# Patient Record
Sex: Male | Born: 1990 | Race: White | Hispanic: No | Marital: Single
Health system: Southern US, Community
[De-identification: ages and names within clinical notes are randomized; demographics above are authoritative.]

## PROBLEM LIST (undated history)

## (undated) DIAGNOSIS — Q6689 Other  specified congenital deformities of feet: Secondary | ICD-10-CM

## (undated) DIAGNOSIS — F840 Autistic disorder: Secondary | ICD-10-CM

## (undated) HISTORY — PX: TESTICLE SURGERY: SHX794

## (undated) HISTORY — PX: OTHER SURGICAL HISTORY: SHX169

---

## 2009-10-23 ENCOUNTER — Inpatient Hospital Stay (HOSPITAL_COMMUNITY): Admission: EM | Admit: 2009-10-23 | Discharge: 2009-12-06 | Payer: Self-pay | Admitting: Emergency Medicine

## 2009-10-24 ENCOUNTER — Ambulatory Visit: Payer: Self-pay | Admitting: Critical Care Medicine

## 2009-10-24 ENCOUNTER — Ambulatory Visit: Payer: Self-pay | Admitting: Internal Medicine

## 2009-10-24 ENCOUNTER — Encounter: Payer: Self-pay | Admitting: Internal Medicine

## 2009-10-25 ENCOUNTER — Ambulatory Visit: Payer: Self-pay | Admitting: Internal Medicine

## 2009-11-25 ENCOUNTER — Ambulatory Visit: Payer: Self-pay | Admitting: Physical Medicine & Rehabilitation

## 2010-06-09 LAB — CBC
HCT: 28 % — ABNORMAL LOW (ref 39.0–52.0)
HCT: 28.1 % — ABNORMAL LOW (ref 39.0–52.0)
Hemoglobin: 9 g/dL — ABNORMAL LOW (ref 13.0–17.0)
Hemoglobin: 9.1 g/dL — ABNORMAL LOW (ref 13.0–17.0)
MCHC: 32 g/dL (ref 30.0–36.0)
MCHC: 32.5 g/dL (ref 30.0–36.0)
MCV: 90.9 fL (ref 78.0–100.0)
MCV: 91.5 fL (ref 78.0–100.0)
MCV: 91.5 fL (ref 78.0–100.0)
Platelets: 291 10*3/uL (ref 150–400)
RBC: 3.42 MIL/uL — ABNORMAL LOW (ref 4.22–5.81)
RDW: 15.7 % — ABNORMAL HIGH (ref 11.5–15.5)
RDW: 15.8 % — ABNORMAL HIGH (ref 11.5–15.5)
WBC: 5.7 10*3/uL (ref 4.0–10.5)
WBC: 7.4 10*3/uL (ref 4.0–10.5)

## 2010-06-09 LAB — COMPREHENSIVE METABOLIC PANEL
ALT: 42 U/L (ref 0–53)
AST: 27 U/L (ref 0–37)
Albumin: 2.8 g/dL — ABNORMAL LOW (ref 3.5–5.2)
Alkaline Phosphatase: 124 U/L — ABNORMAL HIGH (ref 39–117)
BUN: 13 mg/dL (ref 6–23)
CO2: 26 mEq/L (ref 19–32)
CO2: 27 mEq/L (ref 19–32)
Calcium: 8.7 mg/dL (ref 8.4–10.5)
Creatinine, Ser: 0.74 mg/dL (ref 0.4–1.5)
GFR calc Af Amer: >60 mL/min (ref >60)
GFR calc non Af Amer: >60 mL/min (ref >60)
GFR calc non Af Amer: >60 mL/min (ref >60)
Glucose, Bld: 85 mg/dL (ref 70–99)
Glucose, Bld: 90 mg/dL (ref 70–99)
Potassium: 3.7 mEq/L (ref 3.5–5.1)
Sodium: 145 mEq/L (ref 135–145)
Total Protein: 6.6 g/dL (ref 6.0–8.3)
Total Protein: 6.8 g/dL (ref 6.0–8.3)

## 2010-06-09 LAB — BASIC METABOLIC PANEL
BUN: 15 mg/dL (ref 6–23)
CO2: 27 mEq/L (ref 19–32)
Calcium: 8.7 mg/dL (ref 8.4–10.5)
Calcium: 8.8 mg/dL (ref 8.4–10.5)
Chloride: 111 mEq/L (ref 96–112)
Chloride: 111 mEq/L (ref 96–112)
Creatinine, Ser: 0.66 mg/dL (ref 0.4–1.5)
Creatinine, Ser: 0.68 mg/dL (ref 0.4–1.5)
GFR calc Af Amer: >60 mL/min (ref >60)
GFR calc Af Amer: >60 mL/min (ref >60)
GFR calc non Af Amer: >60 mL/min (ref >60)

## 2010-06-09 LAB — GLUCOSE, CAPILLARY: Glucose-Capillary: 93 mg/dL (ref 70–99)

## 2010-06-10 LAB — CBC
HCT: 25.2 % — ABNORMAL LOW (ref 39.0–52.0)
HCT: 27 % — ABNORMAL LOW (ref 39.0–52.0)
HCT: 27.2 % — ABNORMAL LOW (ref 39.0–52.0)
HCT: 28.4 % — ABNORMAL LOW (ref 39.0–52.0)
HCT: 28.6 % — ABNORMAL LOW (ref 39.0–52.0)
HCT: 21.6 % — ABNORMAL LOW (ref 39.0–52.0)
HCT: 21.8 % — ABNORMAL LOW (ref 39.0–52.0)
HCT: 21.9 % — ABNORMAL LOW (ref 39.0–52.0)
HCT: 22.1 % — ABNORMAL LOW (ref 39.0–52.0)
HCT: 23.9 % — ABNORMAL LOW (ref 39.0–52.0)
HCT: 24.5 % — ABNORMAL LOW (ref 39.0–52.0)
HCT: 25 % — ABNORMAL LOW (ref 39.0–52.0)
HCT: 25.4 % — ABNORMAL LOW (ref 39.0–52.0)
HCT: 30.7 % — ABNORMAL LOW (ref 39.0–52.0)
Hemoglobin: 8.4 g/dL — ABNORMAL LOW (ref 13.0–17.0)
Hemoglobin: 8.6 g/dL — ABNORMAL LOW (ref 13.0–17.0)
Hemoglobin: 8.7 g/dL — ABNORMAL LOW (ref 13.0–17.0)
Hemoglobin: 8.9 g/dL — ABNORMAL LOW (ref 13.0–17.0)
Hemoglobin: 8.9 g/dL — ABNORMAL LOW (ref 13.0–17.0)
Hemoglobin: 9.1 g/dL — ABNORMAL LOW (ref 13.0–17.0)
Hemoglobin: 9.3 g/dL — ABNORMAL LOW (ref 13.0–17.0)
Hemoglobin: 10.8 g/dL — ABNORMAL LOW (ref 13.0–17.0)
Hemoglobin: 7.1 g/dL — ABNORMAL LOW (ref 13.0–17.0)
Hemoglobin: 7.5 g/dL — ABNORMAL LOW (ref 13.0–17.0)
Hemoglobin: 7.9 g/dL — ABNORMAL LOW (ref 13.0–17.0)
Hemoglobin: 8.3 g/dL — ABNORMAL LOW (ref 13.0–17.0)
Hemoglobin: 8.4 g/dL — ABNORMAL LOW (ref 13.0–17.0)
Hemoglobin: 8.7 g/dL — ABNORMAL LOW (ref 13.0–17.0)
MCH: 28.6 pg (ref 26.0–34.0)
MCH: 29.1 pg (ref 26.0–34.0)
MCH: 30 pg (ref 26.0–34.0)
MCH: 30.6 pg (ref 26.0–34.0)
MCH: 30.9 pg (ref 26.0–34.0)
MCH: 30.9 pg (ref 26.0–34.0)
MCH: 31 pg (ref 26.0–34.0)
MCHC: 32.5 g/dL (ref 30.0–36.0)
MCHC: 32.6 g/dL (ref 30.0–36.0)
MCHC: 32.9 g/dL (ref 30.0–36.0)
MCHC: 34.4 g/dL (ref 30.0–36.0)
MCHC: 35.1 g/dL (ref 30.0–36.0)
MCV: 89.4 fL (ref 78.0–100.0)
MCV: 90 fL (ref 78.0–100.0)
MCV: 90.2 fL (ref 78.0–100.0)
MCV: 90.7 fL (ref 78.0–100.0)
MCV: 88.2 fL (ref 78.0–100.0)
MCV: 89.6 fL (ref 78.0–100.0)
MCV: 91.6 fL (ref 78.0–100.0)
MCV: 93.2 fL (ref 78.0–100.0)
Platelets: 241 10*3/uL (ref 150–400)
Platelets: 313 10*3/uL (ref 150–400)
Platelets: 110 10*3/uL — ABNORMAL LOW (ref 150–400)
Platelets: 133 10*3/uL — ABNORMAL LOW (ref 150–400)
Platelets: 209 10*3/uL (ref 150–400)
Platelets: 271 10*3/uL (ref 150–400)
Platelets: 74 10*3/uL — ABNORMAL LOW (ref 150–400)
RBC: 2.82 MIL/uL — ABNORMAL LOW (ref 4.22–5.81)
RBC: 2.94 MIL/uL — ABNORMAL LOW (ref 4.22–5.81)
RBC: 2.97 MIL/uL — ABNORMAL LOW (ref 4.22–5.81)
RBC: 3 MIL/uL — ABNORMAL LOW (ref 4.22–5.81)
RBC: 3.01 MIL/uL — ABNORMAL LOW (ref 4.22–5.81)
RBC: 3.06 MIL/uL — ABNORMAL LOW (ref 4.22–5.81)
RBC: 3.06 MIL/uL — ABNORMAL LOW (ref 4.22–5.81)
RBC: 3.18 MIL/uL — ABNORMAL LOW (ref 4.22–5.81)
RBC: 2.33 MIL/uL — ABNORMAL LOW (ref 4.22–5.81)
RBC: 2.39 MIL/uL — ABNORMAL LOW (ref 4.22–5.81)
RBC: 2.45 MIL/uL — ABNORMAL LOW (ref 4.22–5.81)
RBC: 2.48 MIL/uL — ABNORMAL LOW (ref 4.22–5.81)
RBC: 2.52 MIL/uL — ABNORMAL LOW (ref 4.22–5.81)
RBC: 2.63 MIL/uL — ABNORMAL LOW (ref 4.22–5.81)
RBC: 2.68 MIL/uL — ABNORMAL LOW (ref 4.22–5.81)
RBC: 2.88 MIL/uL — ABNORMAL LOW (ref 4.22–5.81)
RBC: 3.26 MIL/uL — ABNORMAL LOW (ref 4.22–5.81)
RBC: 3.5 MIL/uL — ABNORMAL LOW (ref 4.22–5.81)
RDW: 12.4 % (ref 11.5–15.5)
RDW: 13.2 % (ref 11.5–15.5)
RDW: 13.7 % (ref 11.5–15.5)
RDW: 14.1 % (ref 11.5–15.5)
RDW: 14.1 % (ref 11.5–15.5)
RDW: 14.4 % (ref 11.5–15.5)
RDW: 14.5 % (ref 11.5–15.5)
WBC: 5.3 10*3/uL (ref 4.0–10.5)
WBC: 7.6 10*3/uL (ref 4.0–10.5)
WBC: 7.7 10*3/uL (ref 4.0–10.5)
WBC: 7.8 10*3/uL (ref 4.0–10.5)
WBC: 9.3 10*3/uL (ref 4.0–10.5)
WBC: 9.8 10*3/uL (ref 4.0–10.5)
WBC: 10.7 10*3/uL — ABNORMAL HIGH (ref 4.0–10.5)
WBC: 10.8 10*3/uL — ABNORMAL HIGH (ref 4.0–10.5)
WBC: 14.3 10*3/uL — ABNORMAL HIGH (ref 4.0–10.5)
WBC: 4.7 10*3/uL (ref 4.0–10.5)
WBC: 6.3 10*3/uL (ref 4.0–10.5)
WBC: 8.1 10*3/uL (ref 4.0–10.5)
WBC: 8.8 10*3/uL (ref 4.0–10.5)
WBC: 9.4 10*3/uL (ref 4.0–10.5)
WBC: 9.5 10*3/uL (ref 4.0–10.5)

## 2010-06-10 LAB — POCT I-STAT 3, ART BLOOD GAS (G3+)
Acid-base deficit: 12 mmol/L — ABNORMAL HIGH (ref 0.0–2.0)
Acid-base deficit: 12 mmol/L — ABNORMAL HIGH (ref 0.0–2.0)
Acid-base deficit: 13 mmol/L — ABNORMAL HIGH (ref 0.0–2.0)
Acid-base deficit: 13 mmol/L — ABNORMAL HIGH (ref 0.0–2.0)
Bicarbonate: 29.8 mEq/L — ABNORMAL HIGH (ref 20.0–24.0)
Bicarbonate: 11.9 mEq/L — ABNORMAL LOW (ref 20.0–24.0)
Bicarbonate: 13.5 mEq/L — ABNORMAL LOW (ref 20.0–24.0)
Bicarbonate: 14.1 mEq/L — ABNORMAL LOW (ref 20.0–24.0)
Bicarbonate: 14.1 mEq/L — ABNORMAL LOW (ref 20.0–24.0)
O2 Saturation: 81 %
O2 Saturation: 96 %
O2 Saturation: 98 %
O2 Saturation: 98 %
Patient temperature: 99.5
TCO2: 31 mmol/L (ref 0–100)
TCO2: 15 mmol/L (ref 0–100)
pCO2 arterial: 45.1 mmHg — ABNORMAL HIGH (ref 35.0–45.0)
pCO2 arterial: 21.9 mmHg — ABNORMAL LOW (ref 35.0–45.0)
pH, Arterial: 7.427 (ref 7.350–7.450)
pO2, Arterial: 101 mmHg — ABNORMAL HIGH (ref 80.0–100.0)
pO2, Arterial: 112 mmHg — ABNORMAL HIGH (ref 80.0–100.0)
pO2, Arterial: 113 mmHg — ABNORMAL HIGH (ref 80.0–100.0)

## 2010-06-10 LAB — BASIC METABOLIC PANEL
BUN: 17 mg/dL (ref 6–23)
BUN: 5 mg/dL — ABNORMAL LOW (ref 6–23)
BUN: 19 mg/dL (ref 6–23)
BUN: 21 mg/dL (ref 6–23)
BUN: 21 mg/dL (ref 6–23)
BUN: 23 mg/dL (ref 6–23)
BUN: 31 mg/dL — ABNORMAL HIGH (ref 6–23)
CO2: 29 mEq/L (ref 19–32)
CO2: 30 mEq/L (ref 19–32)
CO2: 31 mEq/L (ref 19–32)
CO2: 16 mEq/L — ABNORMAL LOW (ref 19–32)
CO2: 17 mEq/L — ABNORMAL LOW (ref 19–32)
CO2: 17 mEq/L — ABNORMAL LOW (ref 19–32)
CO2: 24 mEq/L (ref 19–32)
CO2: 28 mEq/L (ref 19–32)
Calcium: 7.9 mg/dL — ABNORMAL LOW (ref 8.4–10.5)
Calcium: 8.3 mg/dL — ABNORMAL LOW (ref 8.4–10.5)
Calcium: 7.4 mg/dL — ABNORMAL LOW (ref 8.4–10.5)
Calcium: 7.4 mg/dL — ABNORMAL LOW (ref 8.4–10.5)
Calcium: 7.6 mg/dL — ABNORMAL LOW (ref 8.4–10.5)
Calcium: 7.6 mg/dL — ABNORMAL LOW (ref 8.4–10.5)
Calcium: 7.7 mg/dL — ABNORMAL LOW (ref 8.4–10.5)
Calcium: 7.8 mg/dL — ABNORMAL LOW (ref 8.4–10.5)
Calcium: 7.9 mg/dL — ABNORMAL LOW (ref 8.4–10.5)
Chloride: 103 mEq/L (ref 96–112)
Chloride: 105 mEq/L (ref 96–112)
Chloride: 106 mEq/L (ref 96–112)
Chloride: 107 mEq/L (ref 96–112)
Chloride: 108 mEq/L (ref 96–112)
Chloride: 111 mEq/L (ref 96–112)
Chloride: 123 mEq/L — ABNORMAL HIGH (ref 96–112)
Chloride: 126 mEq/L — ABNORMAL HIGH (ref 96–112)
Chloride: 128 mEq/L — ABNORMAL HIGH (ref 96–112)
Creatinine, Ser: 0.89 mg/dL (ref 0.4–1.5)
Creatinine, Ser: 1.02 mg/dL (ref 0.4–1.5)
Creatinine, Ser: 1.53 mg/dL — ABNORMAL HIGH (ref 0.4–1.5)
Creatinine, Ser: 1.55 mg/dL — ABNORMAL HIGH (ref 0.4–1.5)
Creatinine, Ser: 1.9 mg/dL — ABNORMAL HIGH (ref 0.4–1.5)
GFR calc Af Amer: >60 mL/min (ref >60)
GFR calc Af Amer: >60 mL/min (ref >60)
GFR calc Af Amer: >60 mL/min (ref >60)
GFR calc Af Amer: >60 mL/min (ref >60)
GFR calc Af Amer: >60 mL/min (ref >60)
GFR calc Af Amer: >60 mL/min (ref >60)
GFR calc Af Amer: >60 mL/min (ref >60)
GFR calc Af Amer: 59 mL/min — ABNORMAL LOW (ref >60)
GFR calc Af Amer: >60 mL/min (ref >60)
GFR calc Af Amer: >60 mL/min (ref >60)
GFR calc Af Amer: >60 mL/min (ref >60)
GFR calc Af Amer: >60 mL/min (ref >60)
GFR calc Af Amer: >60 mL/min (ref >60)
GFR calc Af Amer: >60 mL/min (ref >60)
GFR calc Af Amer: >60 mL/min (ref >60)
GFR calc non Af Amer: >60 mL/min (ref >60)
GFR calc non Af Amer: >60 mL/min (ref >60)
GFR calc non Af Amer: >60 mL/min (ref >60)
GFR calc non Af Amer: >60 mL/min (ref >60)
GFR calc non Af Amer: >60 mL/min (ref >60)
GFR calc non Af Amer: 48 mL/min — ABNORMAL LOW (ref >60)
GFR calc non Af Amer: 57 mL/min — ABNORMAL LOW (ref >60)
GFR calc non Af Amer: 57 mL/min — ABNORMAL LOW (ref >60)
GFR calc non Af Amer: 58 mL/min — ABNORMAL LOW (ref >60)
GFR calc non Af Amer: >60 mL/min (ref >60)
GFR calc non Af Amer: >60 mL/min (ref >60)
GFR calc non Af Amer: >60 mL/min (ref >60)
GFR calc non Af Amer: >60 mL/min (ref >60)
GFR calc non Af Amer: >60 mL/min (ref >60)
Glucose, Bld: 124 mg/dL — ABNORMAL HIGH (ref 70–99)
Glucose, Bld: 129 mg/dL — ABNORMAL HIGH (ref 70–99)
Glucose, Bld: 106 mg/dL — ABNORMAL HIGH (ref 70–99)
Glucose, Bld: 123 mg/dL — ABNORMAL HIGH (ref 70–99)
Glucose, Bld: 125 mg/dL — ABNORMAL HIGH (ref 70–99)
Glucose, Bld: 126 mg/dL — ABNORMAL HIGH (ref 70–99)
Glucose, Bld: 156 mg/dL — ABNORMAL HIGH (ref 70–99)
Glucose, Bld: 161 mg/dL — ABNORMAL HIGH (ref 70–99)
Potassium: 2.9 mEq/L — ABNORMAL LOW (ref 3.5–5.1)
Potassium: 3.8 mEq/L (ref 3.5–5.1)
Potassium: 3.9 mEq/L (ref 3.5–5.1)
Potassium: 4.1 mEq/L (ref 3.5–5.1)
Potassium: 4.1 mEq/L (ref 3.5–5.1)
Potassium: 4.3 mEq/L (ref 3.5–5.1)
Potassium: 2.5 mEq/L — CL (ref 3.5–5.1)
Potassium: 3 mEq/L — ABNORMAL LOW (ref 3.5–5.1)
Potassium: 3.3 mEq/L — ABNORMAL LOW (ref 3.5–5.1)
Potassium: 3.5 mEq/L (ref 3.5–5.1)
Potassium: 3.5 mEq/L (ref 3.5–5.1)
Potassium: 3.8 mEq/L (ref 3.5–5.1)
Potassium: 3.8 mEq/L (ref 3.5–5.1)
Potassium: 3.9 mEq/L (ref 3.5–5.1)
Potassium: 4 mEq/L (ref 3.5–5.1)
Sodium: 141 mEq/L (ref 135–145)
Sodium: 142 mEq/L (ref 135–145)
Sodium: 142 mEq/L (ref 135–145)
Sodium: 143 mEq/L (ref 135–145)
Sodium: 145 mEq/L (ref 135–145)
Sodium: 146 mEq/L — ABNORMAL HIGH (ref 135–145)
Sodium: 142 mEq/L (ref 135–145)
Sodium: 145 mEq/L (ref 135–145)
Sodium: 146 mEq/L — ABNORMAL HIGH (ref 135–145)
Sodium: 147 mEq/L — ABNORMAL HIGH (ref 135–145)
Sodium: 148 mEq/L — ABNORMAL HIGH (ref 135–145)
Sodium: 148 mEq/L — ABNORMAL HIGH (ref 135–145)
Sodium: 148 mEq/L — ABNORMAL HIGH (ref 135–145)
Sodium: 149 mEq/L — ABNORMAL HIGH (ref 135–145)

## 2010-06-10 LAB — CLOSTRIDIUM DIFFICILE EIA
C difficile Toxins A+B, EIA: NEGATIVE
C difficile Toxins A+B, EIA: NEGATIVE
C difficile Toxins A+B, EIA: NEGATIVE

## 2010-06-10 LAB — CK TOTAL AND CKMB (NOT AT ARMC)
CK, MB: 13 ng/mL (ref 0.3–4.0)
Total CK: 1650 U/L — ABNORMAL HIGH (ref 7–232)

## 2010-06-10 LAB — CULTURE, RESPIRATORY W GRAM STAIN

## 2010-06-10 LAB — COMPREHENSIVE METABOLIC PANEL
ALT: 62 U/L — ABNORMAL HIGH (ref 0–53)
ALT: 55 U/L — ABNORMAL HIGH (ref 0–53)
AST: 38 U/L — ABNORMAL HIGH (ref 0–37)
Albumin: 1.6 g/dL — ABNORMAL LOW (ref 3.5–5.2)
Alkaline Phosphatase: 132 U/L — ABNORMAL HIGH (ref 39–117)
Alkaline Phosphatase: 143 U/L — ABNORMAL HIGH (ref 39–117)
BUN: 21 mg/dL (ref 6–23)
CO2: 28 mEq/L (ref 19–32)
CO2: 23 mEq/L (ref 19–32)
CO2: 24 mEq/L (ref 19–32)
Calcium: 8.5 mg/dL (ref 8.4–10.5)
Chloride: 114 mEq/L — ABNORMAL HIGH (ref 96–112)
Chloride: 117 mEq/L — ABNORMAL HIGH (ref 96–112)
Creatinine, Ser: 0.88 mg/dL (ref 0.4–1.5)
GFR calc Af Amer: >60 mL/min (ref >60)
GFR calc non Af Amer: >60 mL/min (ref >60)
GFR calc non Af Amer: >60 mL/min (ref >60)
GFR calc non Af Amer: >60 mL/min (ref >60)
Glucose, Bld: 115 mg/dL — ABNORMAL HIGH (ref 70–99)
Glucose, Bld: 116 mg/dL — ABNORMAL HIGH (ref 70–99)
Potassium: 3.5 mEq/L (ref 3.5–5.1)
Potassium: 3.9 mEq/L (ref 3.5–5.1)
Sodium: 138 mEq/L (ref 135–145)
Sodium: 141 mEq/L (ref 135–145)
Total Bilirubin: 0.6 mg/dL (ref 0.3–1.2)
Total Bilirubin: 0.7 mg/dL (ref 0.3–1.2)

## 2010-06-10 LAB — PROTIME-INR
INR: 1 (ref 0.00–1.49)
INR: 1.2 (ref 0.00–1.49)
Prothrombin Time: 13.4 seconds (ref 11.6–15.2)
Prothrombin Time: 15.4 seconds — ABNORMAL HIGH (ref 11.6–15.2)

## 2010-06-10 LAB — GLUCOSE, CAPILLARY
Glucose-Capillary: 101 mg/dL — ABNORMAL HIGH (ref 70–99)
Glucose-Capillary: 114 mg/dL — ABNORMAL HIGH (ref 70–99)
Glucose-Capillary: 117 mg/dL — ABNORMAL HIGH (ref 70–99)
Glucose-Capillary: 120 mg/dL — ABNORMAL HIGH (ref 70–99)
Glucose-Capillary: 121 mg/dL — ABNORMAL HIGH (ref 70–99)
Glucose-Capillary: 131 mg/dL — ABNORMAL HIGH (ref 70–99)
Glucose-Capillary: 146 mg/dL — ABNORMAL HIGH (ref 70–99)
Glucose-Capillary: 86 mg/dL (ref 70–99)
Glucose-Capillary: 91 mg/dL (ref 70–99)
Glucose-Capillary: 97 mg/dL (ref 70–99)

## 2010-06-10 LAB — DIFFERENTIAL
Basophils Absolute: 0 10*3/uL (ref 0.0–0.1)
Basophils Absolute: 0 10*3/uL (ref 0.0–0.1)
Basophils Absolute: 0 10*3/uL (ref 0.0–0.1)
Basophils Absolute: 0 10*3/uL (ref 0.0–0.1)
Eosinophils Absolute: 0 10*3/uL (ref 0.0–0.7)
Eosinophils Absolute: 0 10*3/uL (ref 0.0–0.7)
Eosinophils Relative: 0 % (ref 0–5)
Lymphocytes Relative: 22 % (ref 12–46)
Lymphocytes Relative: 14 % (ref 12–46)
Lymphocytes Relative: 15 % (ref 12–46)
Lymphocytes Relative: 5 % — ABNORMAL LOW (ref 12–46)
Lymphs Abs: 1.7 10*3/uL (ref 0.7–4.0)
Lymphs Abs: 0.4 10*3/uL — ABNORMAL LOW (ref 0.7–4.0)
Monocytes Absolute: 0.2 10*3/uL (ref 0.1–1.0)
Monocytes Relative: 8 % (ref 3–12)
Monocytes Relative: 1 % — ABNORMAL LOW (ref 3–12)
Neutro Abs: 4.8 10*3/uL (ref 1.7–7.7)
Neutro Abs: 6.5 10*3/uL (ref 1.7–7.7)
Neutro Abs: 7.5 10*3/uL (ref 1.7–7.7)
Neutrophils Relative %: 63 % (ref 43–77)

## 2010-06-10 LAB — URINALYSIS, MICROSCOPIC ONLY
Bilirubin Urine: NEGATIVE
Ketones, ur: NEGATIVE mg/dL
Nitrite: NEGATIVE
Nitrite: NEGATIVE
Protein, ur: NEGATIVE mg/dL
Specific Gravity, Urine: 1.017 (ref 1.005–1.030)
Urobilinogen, UA: 0.2 mg/dL (ref 0.0–1.0)
Urobilinogen, UA: 0.2 mg/dL (ref 0.0–1.0)
pH: 6.5 (ref 5.0–8.0)

## 2010-06-10 LAB — BLOOD GAS, ARTERIAL
Acid-base deficit: 2.1 mmol/L — ABNORMAL HIGH (ref 0.0–2.0)
Bicarbonate: 20.8 mEq/L (ref 20.0–24.0)
Bicarbonate: 21.3 mEq/L (ref 20.0–24.0)
FIO2: 0.3 %
MECHVT: 530 mL
O2 Saturation: 98.6 %
Patient temperature: 99.1
Patient temperature: 99.4
TCO2: 21.7 mmol/L (ref 0–100)
TCO2: 22.1 mmol/L (ref 0–100)
TCO2: 22.3 mmol/L (ref 0–100)
pCO2 arterial: 30.9 mmHg — ABNORMAL LOW (ref 35.0–45.0)
pH, Arterial: 7.443 (ref 7.350–7.450)
pH, Arterial: 7.453 — ABNORMAL HIGH (ref 7.350–7.450)
pH, Arterial: 7.501 — ABNORMAL HIGH (ref 7.350–7.450)
pO2, Arterial: 103 mmHg — ABNORMAL HIGH (ref 80.0–100.0)

## 2010-06-10 LAB — CULTURE, BLOOD (ROUTINE X 2)
Culture: NO GROWTH
Culture: NO GROWTH

## 2010-06-10 LAB — MAGNESIUM
Magnesium: 2 mg/dL (ref 1.5–2.5)
Magnesium: 1.6 mg/dL (ref 1.5–2.5)
Magnesium: 1.8 mg/dL (ref 1.5–2.5)
Magnesium: 1.8 mg/dL (ref 1.5–2.5)
Magnesium: 1.9 mg/dL (ref 1.5–2.5)
Magnesium: 1.9 mg/dL (ref 1.5–2.5)

## 2010-06-10 LAB — VANCOMYCIN, TROUGH
Vancomycin Tr: 26.8 ug/mL (ref 10.0–20.0)
Vancomycin Tr: 37.1 ug/mL (ref 10.0–20.0)

## 2010-06-10 LAB — URINE CULTURE
Colony Count: NO GROWTH
Colony Count: NO GROWTH
Culture: NO GROWTH

## 2010-06-10 LAB — APTT
aPTT: 34 seconds (ref 24–37)
aPTT: 46 seconds — ABNORMAL HIGH (ref 24–37)

## 2010-06-10 LAB — CARDIAC PANEL(CRET KIN+CKTOT+MB+TROPI)
Relative Index: 0.8 (ref 0.0–2.5)
Total CK: 2342 U/L — ABNORMAL HIGH (ref 7–232)
Troponin I: 1.83 ng/mL (ref 0.00–0.06)

## 2010-06-10 LAB — HEPATIC FUNCTION PANEL
ALT: 54 U/L — ABNORMAL HIGH (ref 0–53)
AST: 57 U/L — ABNORMAL HIGH (ref 0–37)
Albumin: 1.5 g/dL — ABNORMAL LOW (ref 3.5–5.2)
Alkaline Phosphatase: 57 U/L (ref 39–117)
Bilirubin, Direct: 0.6 mg/dL — ABNORMAL HIGH (ref 0.0–0.3)
Total Bilirubin: 1.5 mg/dL — ABNORMAL HIGH (ref 0.3–1.2)

## 2010-06-10 LAB — PHOSPHORUS
Phosphorus: 2.5 mg/dL (ref 2.3–4.6)
Phosphorus: 3.3 mg/dL (ref 2.3–4.6)
Phosphorus: 2.1 mg/dL — ABNORMAL LOW (ref 2.3–4.6)
Phosphorus: 2.6 mg/dL (ref 2.3–4.6)
Phosphorus: 2.7 mg/dL (ref 2.3–4.6)

## 2010-06-10 LAB — URINE MICROSCOPIC-ADD ON

## 2010-06-10 LAB — IRON AND TIBC
Iron: 27 ug/dL — ABNORMAL LOW (ref 42–135)
TIBC: 254 ug/dL (ref 215–435)

## 2010-06-10 LAB — URINALYSIS, ROUTINE W REFLEX MICROSCOPIC
Bilirubin Urine: NEGATIVE
Glucose, UA: NEGATIVE mg/dL
Ketones, ur: 15 mg/dL — AB
Leukocytes, UA: NEGATIVE
pH: 5.5 (ref 5.0–8.0)

## 2010-06-10 LAB — RETICULOCYTES
RBC.: 2.93 MIL/uL — ABNORMAL LOW (ref 4.22–5.81)
Retic Count, Absolute: 44 10*3/uL (ref 19.0–186.0)

## 2010-06-10 LAB — POCT I-STAT, CHEM 8
BUN: 21 mg/dL (ref 6–23)
Creatinine, Ser: 1.1 mg/dL (ref 0.4–1.5)
Potassium: 3.8 mEq/L (ref 3.5–5.1)
Sodium: 143 mEq/L (ref 135–145)
TCO2: 28 mmol/L (ref 0–100)

## 2010-06-10 LAB — FERRITIN: Ferritin: 364 ng/mL — ABNORMAL HIGH (ref 22–322)

## 2010-06-10 LAB — CARBOXYHEMOGLOBIN: Total hemoglobin: 10.2 g/dL — ABNORMAL LOW (ref 13.5–18.0)

## 2010-06-10 LAB — VITAMIN B12: Vitamin B-12: 1091 pg/mL — ABNORMAL HIGH (ref 211–911)

## 2010-06-10 LAB — OSMOLALITY, URINE: Osmolality, Ur: 547 mOsm/kg (ref 390–1090)

## 2010-06-10 LAB — MRSA PCR SCREENING
MRSA by PCR: NEGATIVE
MRSA by PCR: NEGATIVE

## 2010-06-10 LAB — SODIUM, URINE, RANDOM: Sodium, Ur: 60 mEq/L

## 2010-06-10 LAB — LACTIC ACID, PLASMA: Lactic Acid, Venous: 1 mmol/L (ref 0.5–2.2)

## 2010-06-10 LAB — CALCIUM, IONIZED: Calcium, Ion: 1.26 mmol/L (ref 1.12–1.32)

## 2010-06-11 LAB — DIFFERENTIAL
Band Neutrophils: 17 % — ABNORMAL HIGH (ref 0–10)
Basophils Absolute: 0 10*3/uL (ref 0.0–0.1)
Basophils Relative: 0 % (ref 0–1)
Basophils Relative: 0 % (ref 0–1)
Blasts: 0 %
Eosinophils Absolute: 0 10*3/uL (ref 0.0–0.7)
Eosinophils Absolute: 0 10*3/uL (ref 0.0–0.7)
Eosinophils Relative: 0 % (ref 0–5)
Eosinophils Relative: 0 % (ref 0–5)
Lymphocytes Relative: 4 % — ABNORMAL LOW (ref 12–46)
Lymphocytes Relative: 6 % — ABNORMAL LOW (ref 12–46)
Lymphs Abs: 0.2 10*3/uL — ABNORMAL LOW (ref 0.7–4.0)
Lymphs Abs: 0.4 K/uL — ABNORMAL LOW (ref 0.7–4.0)
Metamyelocytes Relative: 0 %
Monocytes Absolute: 0 10*3/uL — ABNORMAL LOW (ref 0.1–1.0)
Monocytes Absolute: 0.4 K/uL (ref 0.1–1.0)
Monocytes Relative: 5 % (ref 3–12)
Monocytes Relative: 6 % (ref 3–12)
Myelocytes: 0 %
Neutro Abs: 4.4 10*3/uL (ref 1.7–7.7)
Neutro Abs: 2.6 10*3/uL (ref 1.7–7.7)
Neutro Abs: 5.1 K/uL (ref 1.7–7.7)
Neutrophils Relative %: 71 % (ref 43–77)
Promyelocytes Absolute: 0 %
nRBC: 0 /100{WBCs}

## 2010-06-11 LAB — COMPREHENSIVE METABOLIC PANEL
ALT: 91 U/L — ABNORMAL HIGH (ref 0–53)
AST: 114 U/L — ABNORMAL HIGH (ref 0–37)
AST: 123 U/L — ABNORMAL HIGH (ref 0–37)
Albumin: 2.1 g/dL — ABNORMAL LOW (ref 3.5–5.2)
Albumin: 2.3 g/dL — ABNORMAL LOW (ref 3.5–5.2)
Albumin: 2.5 g/dL — ABNORMAL LOW (ref 3.5–5.2)
Alkaline Phosphatase: 53 U/L (ref 39–117)
BUN: 41 mg/dL — ABNORMAL HIGH (ref 6–23)
Calcium: 7.8 mg/dL — ABNORMAL LOW (ref 8.4–10.5)
Chloride: 120 mEq/L — ABNORMAL HIGH (ref 96–112)
Chloride: 121 mEq/L — ABNORMAL HIGH (ref 96–112)
Creatinine, Ser: 2.54 mg/dL — ABNORMAL HIGH (ref 0.4–1.5)
Creatinine, Ser: 2.6 mg/dL — ABNORMAL HIGH (ref 0.4–1.5)
GFR calc Af Amer: 46 mL/min — ABNORMAL LOW (ref >60)
GFR calc Af Amer: 38 mL/min — ABNORMAL LOW (ref >60)
GFR calc non Af Amer: 31 mL/min — ABNORMAL LOW (ref >60)
GFR calc non Af Amer: 32 mL/min — ABNORMAL LOW (ref >60)
Potassium: 3.6 mEq/L (ref 3.5–5.1)
Sodium: 145 mEq/L (ref 135–145)
Total Bilirubin: 1.4 mg/dL — ABNORMAL HIGH (ref 0.3–1.2)
Total Bilirubin: 1.2 mg/dL (ref 0.3–1.2)
Total Protein: 5.5 g/dL — ABNORMAL LOW (ref 6.0–8.3)

## 2010-06-11 LAB — CULTURE, BLOOD (ROUTINE X 2)
Culture: NO GROWTH
Culture: NO GROWTH
Report Status: 8042011

## 2010-06-11 LAB — CLOSTRIDIUM DIFFICILE EIA: C difficile Toxins A+B, EIA: NEGATIVE

## 2010-06-11 LAB — PROTIME-INR
INR: 1.59 — ABNORMAL HIGH (ref 0.00–1.49)
INR: 1.67 — ABNORMAL HIGH (ref 0.00–1.49)
Prothrombin Time: 19.6 seconds — ABNORMAL HIGH (ref 11.6–15.2)

## 2010-06-11 LAB — WOUND CULTURE

## 2010-06-11 LAB — CBC
HCT: 35.4 % — ABNORMAL LOW (ref 39.0–52.0)
HCT: 45.5 % (ref 39.0–52.0)
Hemoglobin: 15.6 g/dL (ref 13.0–17.0)
MCH: 31.1 pg (ref 26.0–34.0)
MCH: 31.4 pg (ref 26.0–34.0)
MCHC: 33.9 g/dL (ref 30.0–36.0)
MCHC: 34.2 g/dL (ref 30.0–36.0)
MCV: 91.6 fL (ref 78.0–100.0)
Platelets: 105 10*3/uL — ABNORMAL LOW (ref 150–400)
Platelets: 68 10*3/uL — ABNORMAL LOW (ref 150–400)
RBC: 3.79 MIL/uL — ABNORMAL LOW (ref 4.22–5.81)
RBC: 4.97 MIL/uL (ref 4.22–5.81)
RDW: 13.3 % (ref 11.5–15.5)
RDW: 12.9 % (ref 11.5–15.5)
RDW: 13.5 % (ref 11.5–15.5)
WBC: 4.6 10*3/uL (ref 4.0–10.5)
WBC: 5.9 K/uL (ref 4.0–10.5)

## 2010-06-11 LAB — ANTI-NUCLEAR AB-TITER (ANA TITER)

## 2010-06-11 LAB — URINE MICROSCOPIC-ADD ON

## 2010-06-11 LAB — URINE CULTURE
Colony Count: NO GROWTH
Culture: NO GROWTH

## 2010-06-11 LAB — BASIC METABOLIC PANEL
CO2: 21 mEq/L (ref 19–32)
Chloride: 114 mEq/L — ABNORMAL HIGH (ref 96–112)
Creatinine, Ser: 2.09 mg/dL — ABNORMAL HIGH (ref 0.4–1.5)
GFR calc Af Amer: 49 mL/min — ABNORMAL LOW (ref >60)
Glucose, Bld: 219 mg/dL — ABNORMAL HIGH (ref 70–99)

## 2010-06-11 LAB — URINALYSIS, ROUTINE W REFLEX MICROSCOPIC
Glucose, UA: NEGATIVE mg/dL
Ketones, ur: NEGATIVE mg/dL
Leukocytes, UA: NEGATIVE
Leukocytes, UA: NEGATIVE
Protein, ur: 100 mg/dL — AB
Urobilinogen, UA: 0.2 mg/dL (ref 0.0–1.0)
pH: 5.5 (ref 5.0–8.0)

## 2010-06-11 LAB — CARDIAC PANEL(CRET KIN+CKTOT+MB+TROPI)
Relative Index: 0.9 (ref 0.0–2.5)
Relative Index: 0.8 (ref 0.0–2.5)
Total CK: 3542 U/L — ABNORMAL HIGH (ref 7–232)
Troponin I: 3.01 ng/mL (ref 0.00–0.06)

## 2010-06-11 LAB — TYPE AND SCREEN
ABO/RH(D): O POS
ABO/RH(D): O POS

## 2010-06-11 LAB — MRSA PCR SCREENING: MRSA by PCR: POSITIVE — AB

## 2010-06-11 LAB — BLOOD GAS, ARTERIAL
O2 Saturation: 91 %
pCO2 arterial: 32.9 mmHg — ABNORMAL LOW (ref 35.0–45.0)
pO2, Arterial: 58.7 mmHg — ABNORMAL LOW (ref 80.0–100.0)

## 2010-06-11 LAB — BASIC METABOLIC PANEL WITH GFR
BUN: 34 mg/dL — ABNORMAL HIGH (ref 6–23)
Calcium: 8.6 mg/dL (ref 8.4–10.5)
GFR calc non Af Amer: 40 mL/min — ABNORMAL LOW (ref >60)
Potassium: 3.7 meq/L (ref 3.5–5.1)
Sodium: 143 meq/L (ref 135–145)

## 2010-06-11 LAB — LACTIC ACID, PLASMA: Lactic Acid, Venous: 2.3 mmol/L — ABNORMAL HIGH (ref 0.5–2.2)

## 2010-06-11 LAB — ANTISTREPTOLYSIN O TITER: ASO: 66 IU/mL (ref 0–116)

## 2010-06-11 LAB — APTT: aPTT: 43 seconds — ABNORMAL HIGH (ref 24–37)

## 2010-06-11 LAB — ANA: Anti Nuclear Antibody(ANA): POSITIVE — AB

## 2010-06-11 LAB — C4 COMPLEMENT: Complement C4, Body Fluid: 24 mg/dL (ref 16–47)

## 2010-06-11 LAB — GLUCOSE, CAPILLARY
Glucose-Capillary: 52 mg/dL — ABNORMAL LOW (ref 70–99)
Glucose-Capillary: 56 mg/dL — ABNORMAL LOW (ref 70–99)

## 2010-06-11 LAB — CREATININE, URINE, RANDOM: Creatinine, Urine: 325.37 mg/dL

## 2010-06-11 LAB — DIC (DISSEMINATED INTRAVASCULAR COAGULATION)PANEL
Prothrombin Time: 19.3 seconds — ABNORMAL HIGH (ref 11.6–15.2)
Smear Review: NONE SEEN

## 2010-06-11 LAB — MAGNESIUM: Magnesium: 1.7 mg/dL (ref 1.5–2.5)

## 2010-06-11 LAB — PROTEIN / CREATININE RATIO, URINE: Protein Creatinine Ratio: 0.84 — ABNORMAL HIGH (ref 0.00–0.15)

## 2011-02-11 IMAGING — CR DG CHEST 1V PORT
1 series · 1 of 1 positions shown · non-contrast
Comparison: 11/10/2009

CLINICAL DATA: Septic shock and evaluate feeding tube placement.

PORTABLE CHEST - 1 VIEW

[view not recorded]
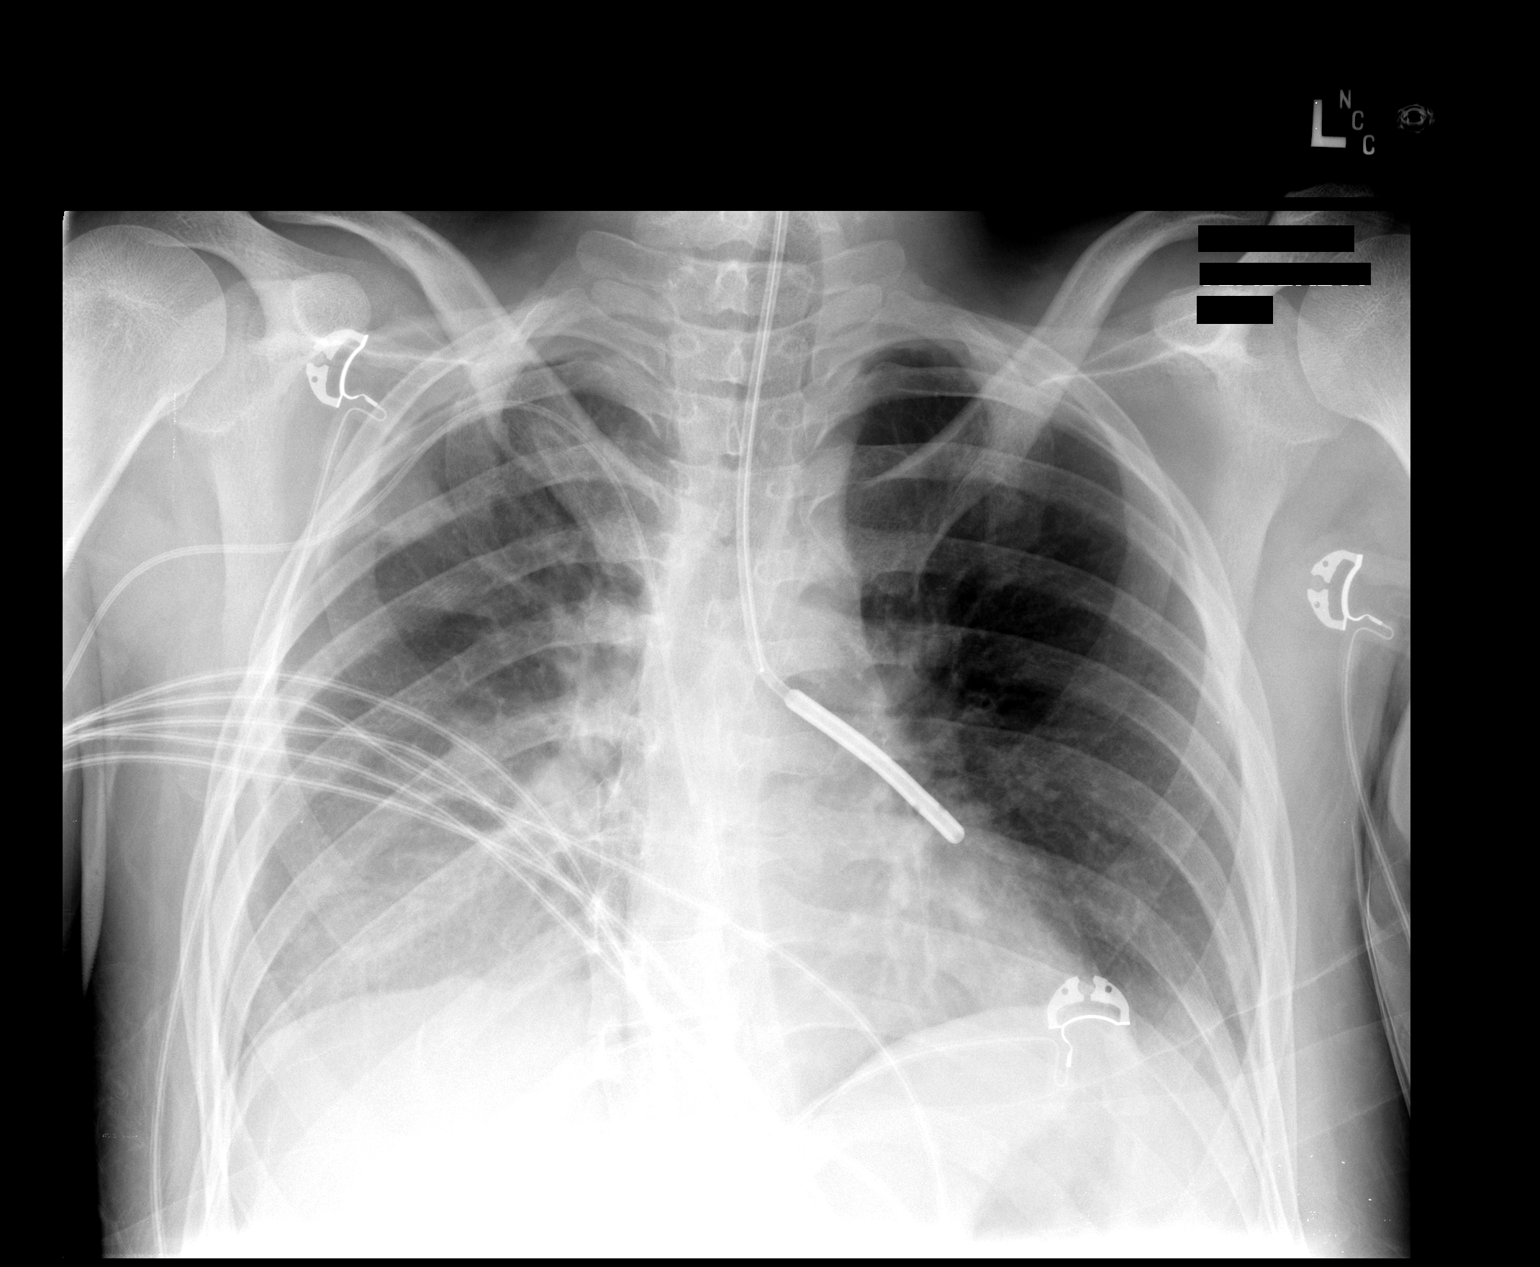

[1 of 1 positions shown; findings below may reference images not displayed]

FINDINGS: The feeding tube extends into the left mainstem bronchus.
PICC line tip is in the lower SVC.  Again noted are hazy densities
in the right lower lung concerning for airspace disease and cannot
exclude right pleural fluid.  In addition, there are vague
densities in the left costophrenic angle.  Heart size is stable.
IMPRESSION: Feeding tube extends into the left mainstem bronchus.  Findings
called to the patient's nurse, Anseliho, on 11/13/2009 at [DATE] a.m.

Persistent right lung airspace disease and probable pleural fluid.

Left basilar atelectasis versus airspace disease.

## 2011-02-13 IMAGING — CR DG ABD PORTABLE 1V
1 series · 1 of 1 positions shown · non-contrast
Comparison: 11/10/2009

CLINICAL DATA: Panda placement

ABDOMEN - 1 VIEW

[view not recorded]
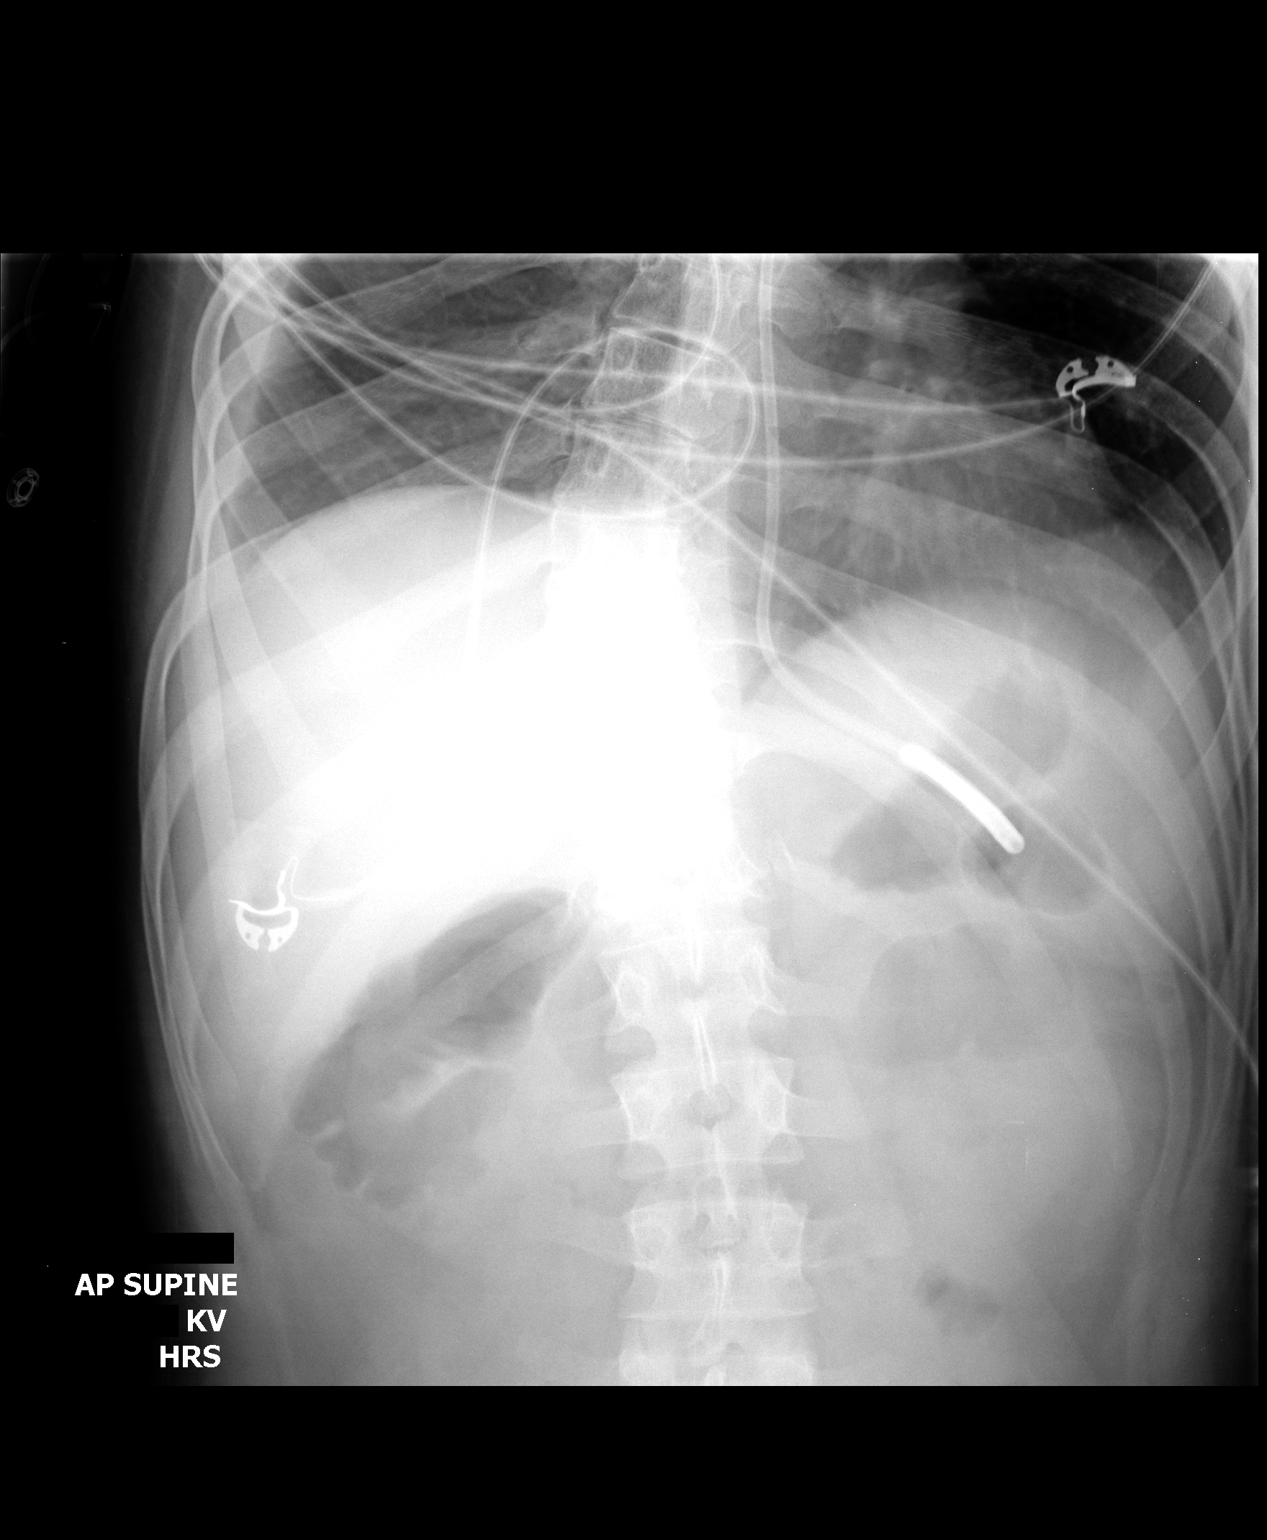

[1 of 1 positions shown; findings below may reference images not displayed]

FINDINGS: Panda has been pulled back with the tip now in the
proximal body of the stomach.  Negative for bowel obstruction.
IMPRESSION: Panda has been pulled back with the tip now in the proximal body of
the stomach.

## 2011-02-13 IMAGING — CR DG CHEST 1V PORT
1 series · 1 of 1 positions shown · non-contrast
Comparison: Portable chest x-ray 11/13/2009.

CLINICAL DATA: Feeding tube repositioning.  Sepsis.  Follow up
right basilar atelectasis versus pneumonia.

PORTABLE CHEST - 1 VIEW [DATE]/[id number] hours:

[view not recorded]
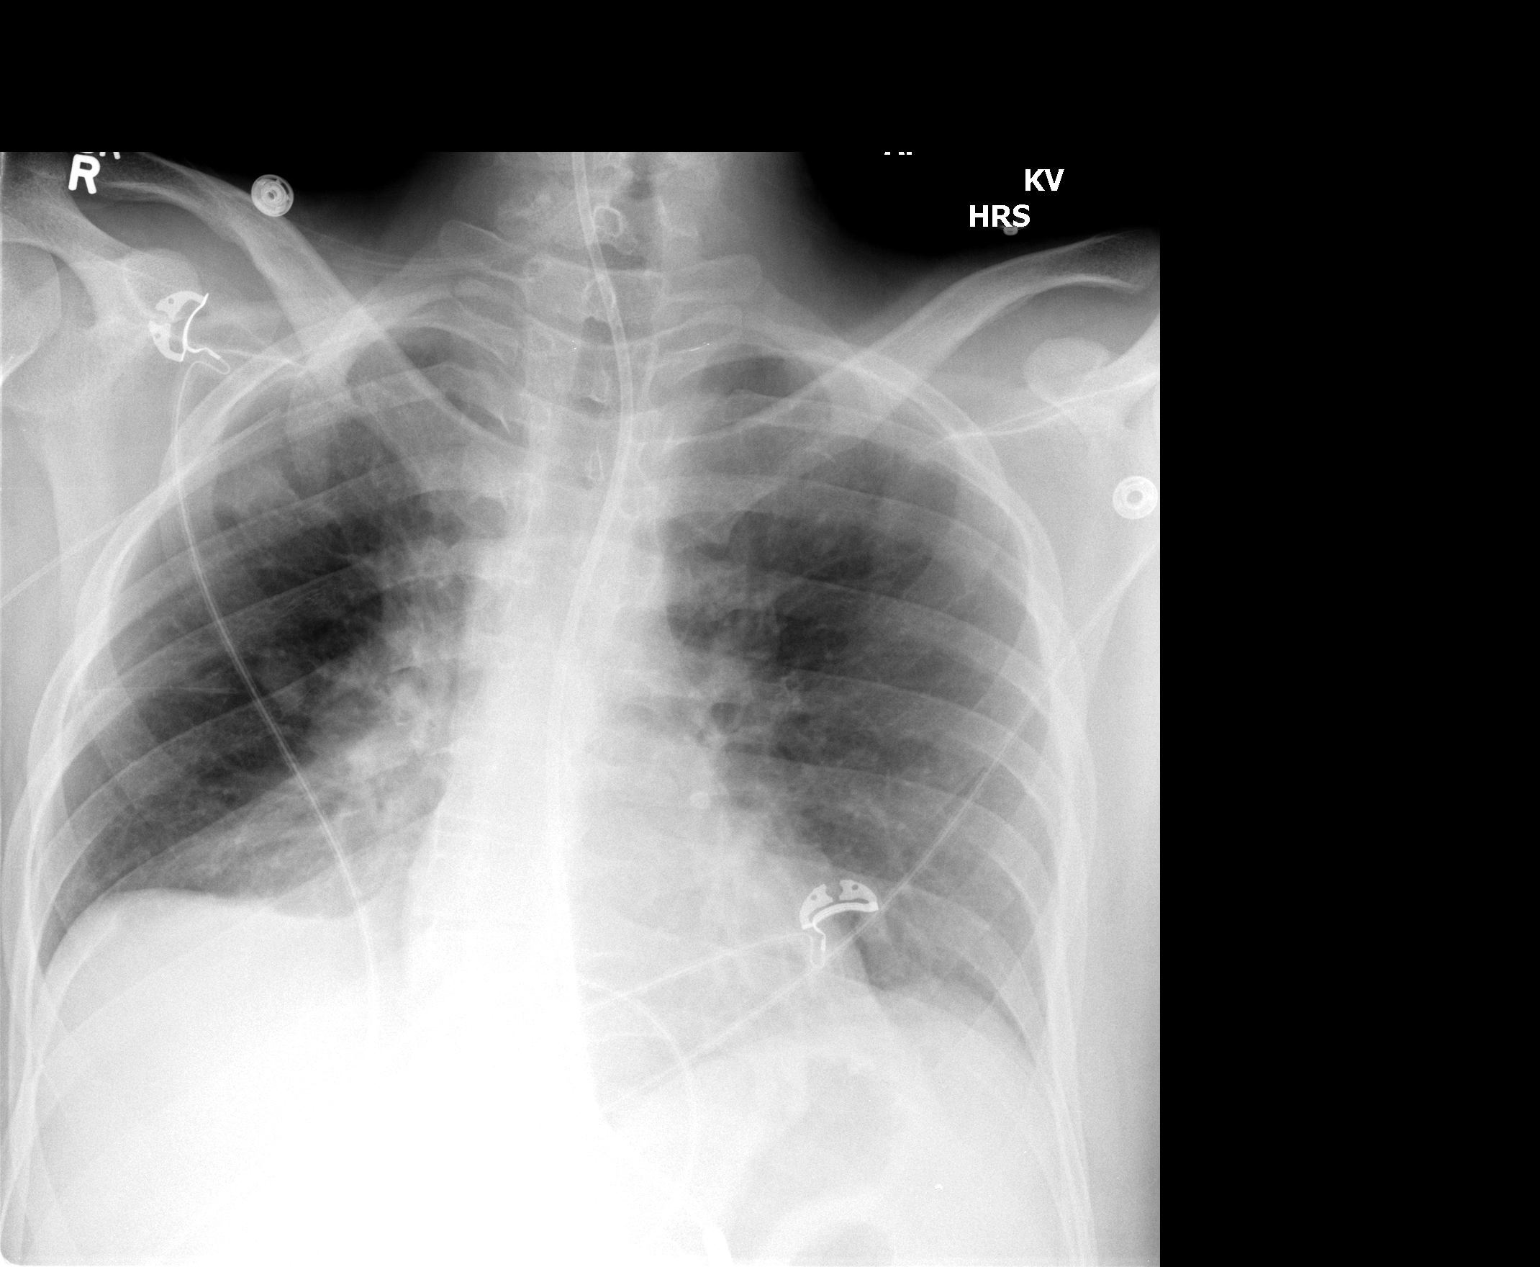

[1 of 1 positions shown; findings below may reference images not displayed]

FINDINGS: Feeding tube advanced such that it is now in the proximal
body of the tongue; the proximal end of the weighted tip was
included on the x-ray. Right arm.  Withdrawn in the interval since
that its tip is now in the right subclavian vein.  Interval
improvement in aeration in the right lower lobe, with residual mild
atelectasis persisting.  Lungs otherwise clear.  Prominent
paracardiac fat pad on the right.  Cardiomediastinal silhouette
unremarkable for the AP portable technique.
IMPRESSION: Feeding tube tip now in the proximal body the stomach.  Improved
aeration in the right lower lobe with mild atelectasis persisting.
No new pulmonary parenchymal abnormalities.  Right arm PICC tip
withdrawn such that its tip is now in the right subclavian vein.

## 2011-02-19 IMAGING — CR DG CHEST 1V PORT
1 series · 1 of 1 positions shown · non-contrast
Comparison: 11/15/2009 and CT from a 10/25/2009

CLINICAL DATA: Septic shock and crackles.

PORTABLE CHEST - 1 VIEW

[view not recorded]
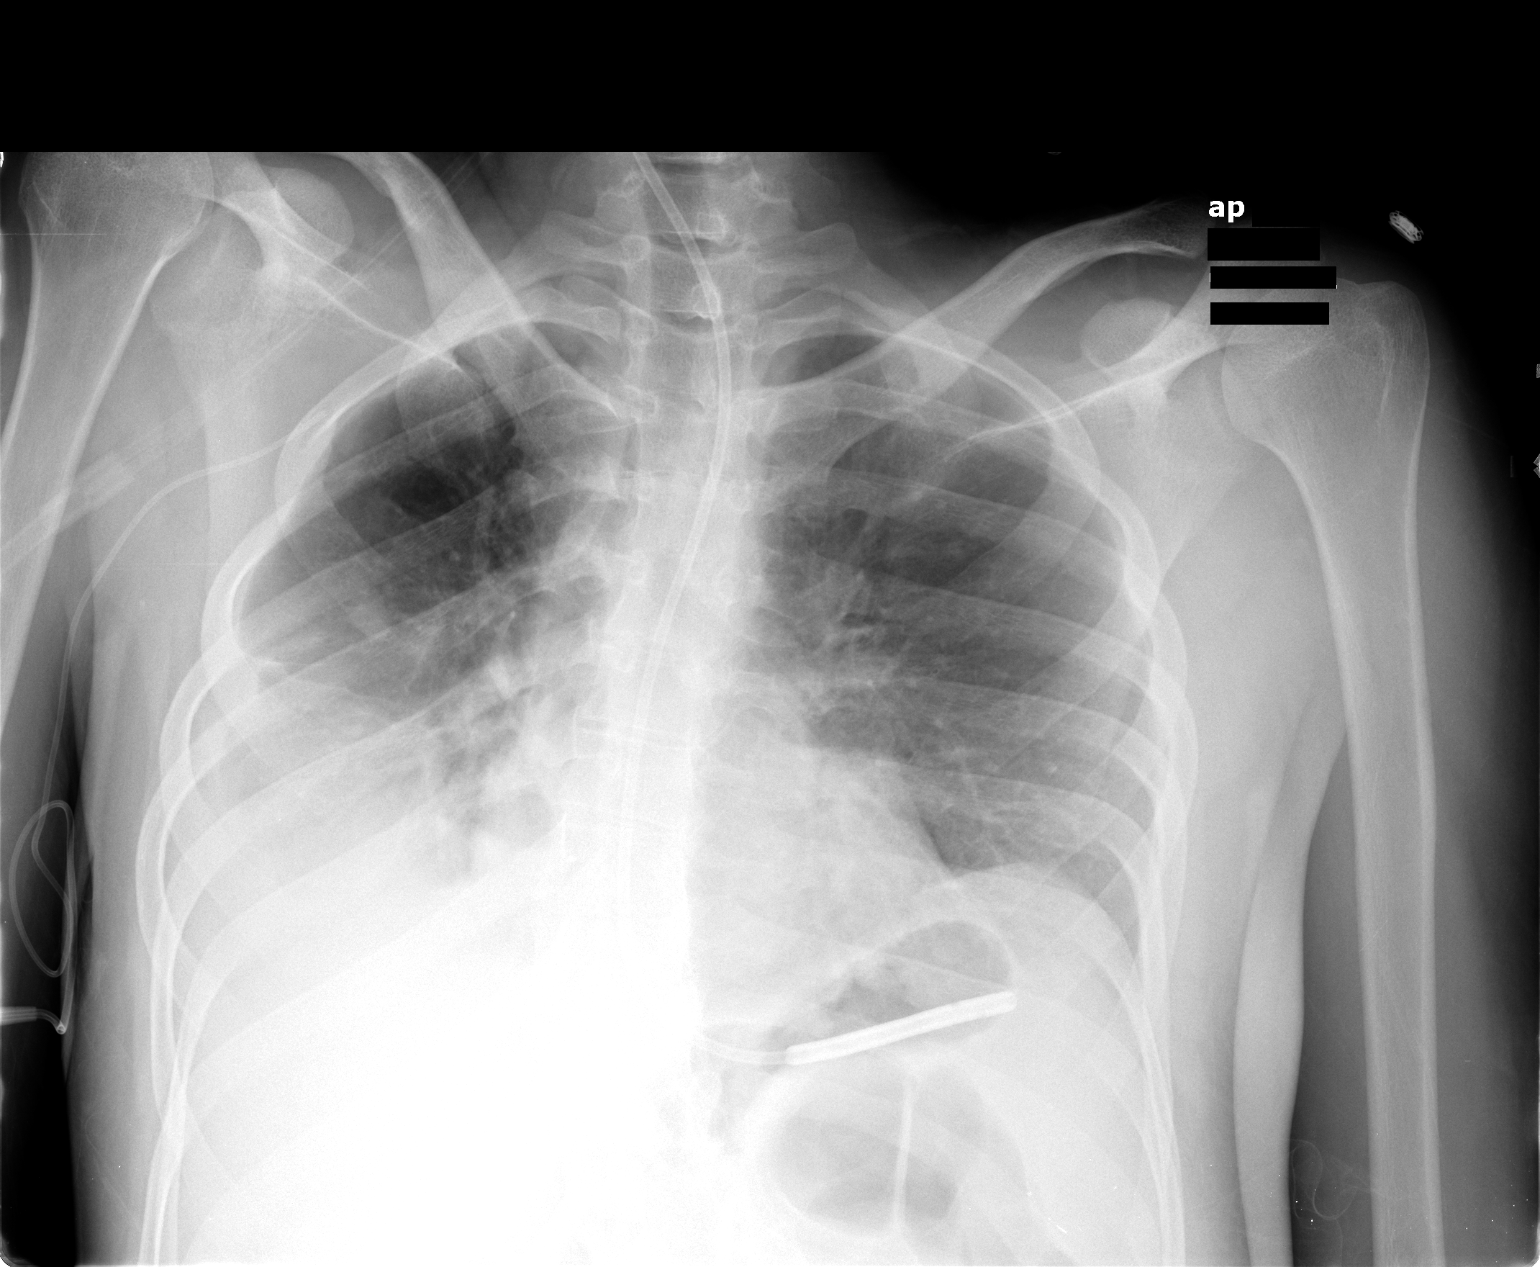

[1 of 1 positions shown; findings below may reference images not displayed]

FINDINGS: Feeding tube is in the gastric fundus region.  Increased
densities in the right lung base are suggestive for pleural fluid
and atelectasis.  Heart size is stable.  PICC line tip is in the
right subclavian region and unchanged. Focal nodular density in the
right mid lung.
IMPRESSION: Increased densities in the right lung base most compatible with
pleural fluid and atelectasis.

Probable nodule in the right mid lung.  Known pulmonary nodules by
previous CT.

## 2011-02-23 IMAGING — CR DG CHEST 1V PORT
1 series · 1 of 1 positions shown · non-contrast
Comparison: Portable chest x-ray 11/21/2009 and dating back to
11/08/2009.

CLINICAL DATA: Fever.  Septic shock.  Follow up right pleural
effusion.

PORTABLE CHEST - 1 VIEW [DATE]/[id number] hours:

[view not recorded]
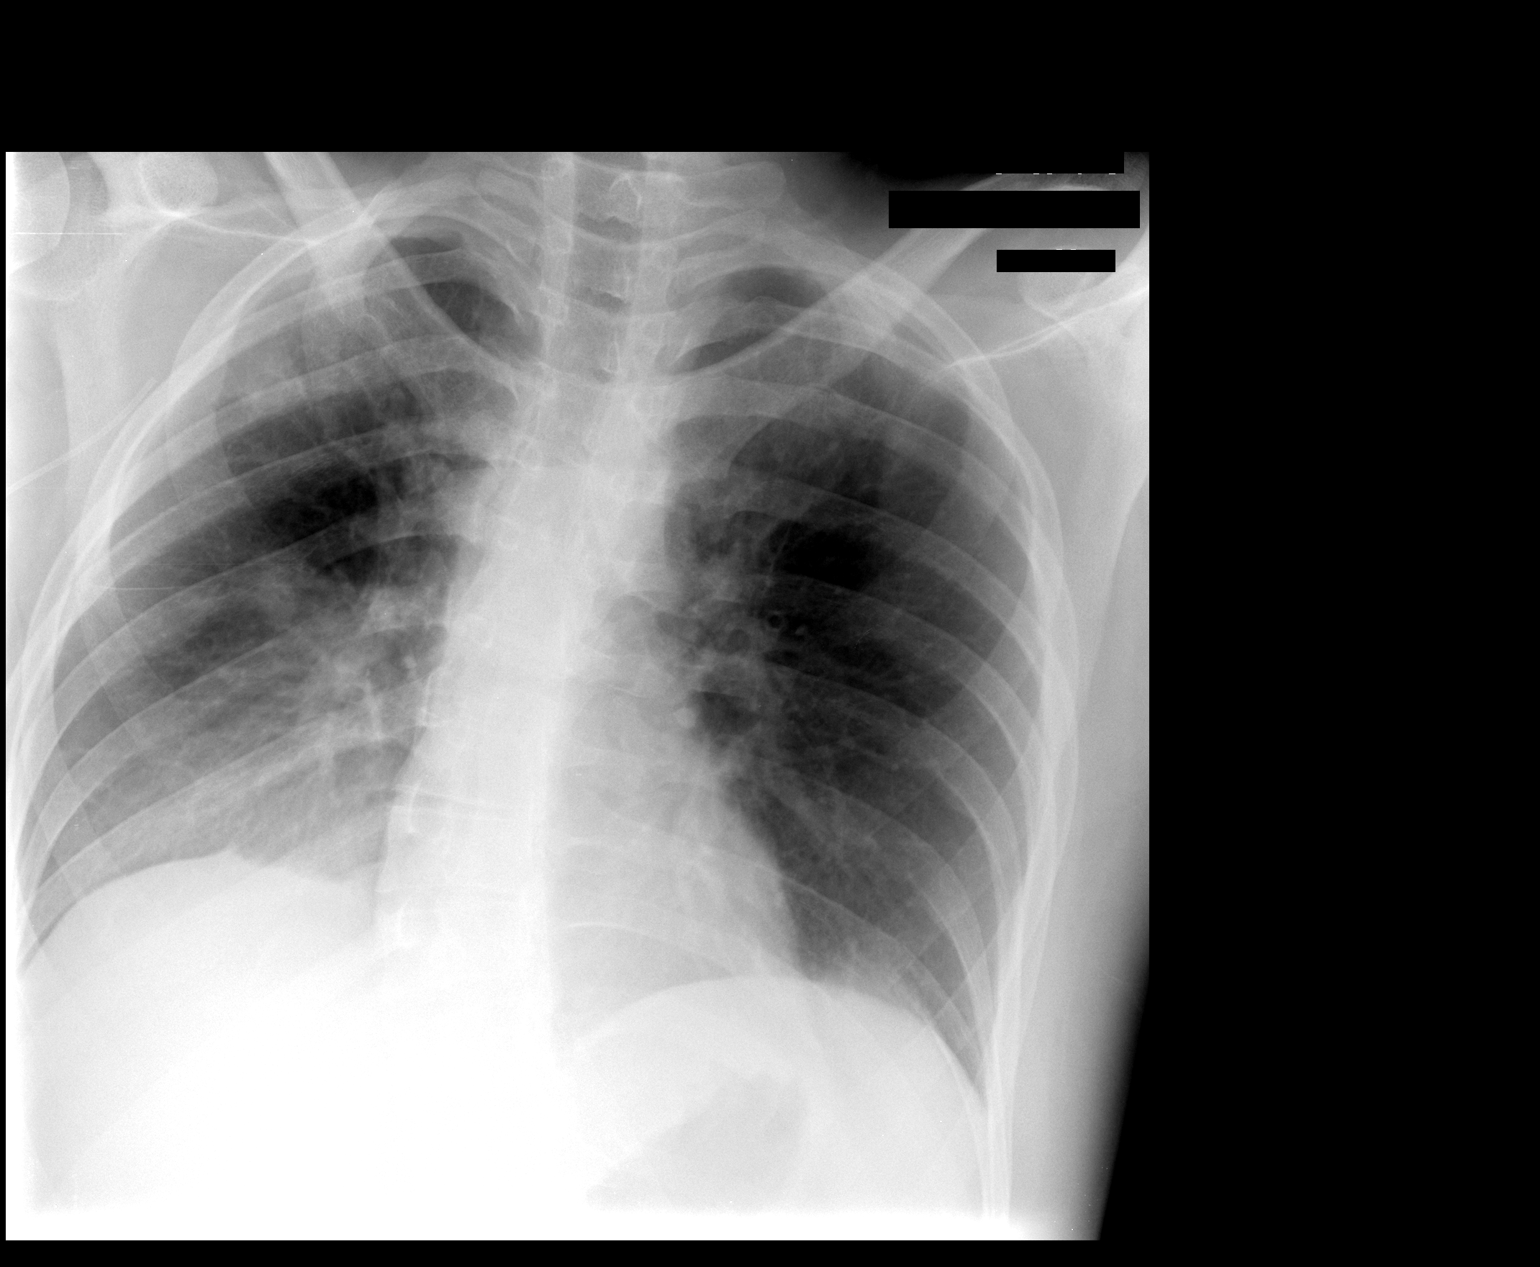

[1 of 1 positions shown; findings below may reference images not displayed]

FINDINGS: Cardiomediastinal silhouette unremarkable for the AP
portable technique, unchanged.  Interval reduction in the right
pleural effusion, though a small to moderate sized effusion
persists.  Marked improvement in aeration in the right lower lobe,
with only mild atelectasis persisting.  Minimal atelectasis at the
left lung base.  Right lung nodules better demonstrated currently.
Right arm PICC/midline catheter tip in the right axillary vein,
unchanged.
IMPRESSION: Decrease in size of the right pleural effusion since the
examination 4 days ago, though a small to moderate sized effusion
persists.  Marked improvement in aeration of the right lower lobe,
with only mild atelectasis persisting.  Minimal atelectasis at the
left base.  No new abnormalities.

## 2021-11-06 ENCOUNTER — Inpatient Hospital Stay (HOSPITAL_COMMUNITY)
Admission: EM | Admit: 2021-11-06 | Discharge: 2021-12-25 | DRG: 870 | Disposition: E | Payer: Medicare Other | Attending: Internal Medicine | Admitting: Internal Medicine

## 2021-11-06 ENCOUNTER — Emergency Department (HOSPITAL_COMMUNITY): Payer: Medicare Other

## 2021-11-06 ENCOUNTER — Encounter (HOSPITAL_COMMUNITY): Payer: Self-pay

## 2021-11-06 ENCOUNTER — Inpatient Hospital Stay (HOSPITAL_COMMUNITY): Payer: Medicare Other

## 2021-11-06 ENCOUNTER — Other Ambulatory Visit: Payer: Self-pay

## 2021-11-06 DIAGNOSIS — J9811 Atelectasis: Secondary | ICD-10-CM | POA: Diagnosis present

## 2021-11-06 DIAGNOSIS — J9601 Acute respiratory failure with hypoxia: Secondary | ICD-10-CM | POA: Diagnosis present

## 2021-11-06 DIAGNOSIS — G9341 Metabolic encephalopathy: Secondary | ICD-10-CM | POA: Diagnosis not present

## 2021-11-06 DIAGNOSIS — J189 Pneumonia, unspecified organism: Secondary | ICD-10-CM

## 2021-11-06 DIAGNOSIS — K529 Noninfective gastroenteritis and colitis, unspecified: Secondary | ICD-10-CM | POA: Diagnosis not present

## 2021-11-06 DIAGNOSIS — R6521 Severe sepsis with septic shock: Secondary | ICD-10-CM | POA: Diagnosis present

## 2021-11-06 DIAGNOSIS — J850 Gangrene and necrosis of lung: Secondary | ICD-10-CM | POA: Diagnosis not present

## 2021-11-06 DIAGNOSIS — Z9911 Dependence on respirator [ventilator] status: Secondary | ICD-10-CM | POA: Diagnosis not present

## 2021-11-06 DIAGNOSIS — K7689 Other specified diseases of liver: Secondary | ICD-10-CM | POA: Diagnosis present

## 2021-11-06 DIAGNOSIS — I480 Paroxysmal atrial fibrillation: Secondary | ICD-10-CM | POA: Diagnosis not present

## 2021-11-06 DIAGNOSIS — F840 Autistic disorder: Secondary | ICD-10-CM | POA: Diagnosis present

## 2021-11-06 DIAGNOSIS — J939 Pneumothorax, unspecified: Secondary | ICD-10-CM | POA: Diagnosis present

## 2021-11-06 DIAGNOSIS — E870 Hyperosmolality and hypernatremia: Secondary | ICD-10-CM | POA: Diagnosis present

## 2021-11-06 DIAGNOSIS — Z8614 Personal history of Methicillin resistant Staphylococcus aureus infection: Secondary | ICD-10-CM

## 2021-11-06 DIAGNOSIS — D638 Anemia in other chronic diseases classified elsewhere: Secondary | ICD-10-CM | POA: Diagnosis present

## 2021-11-06 DIAGNOSIS — Z515 Encounter for palliative care: Secondary | ICD-10-CM

## 2021-11-06 DIAGNOSIS — R6520 Severe sepsis without septic shock: Secondary | ICD-10-CM

## 2021-11-06 DIAGNOSIS — N179 Acute kidney failure, unspecified: Secondary | ICD-10-CM | POA: Diagnosis present

## 2021-11-06 DIAGNOSIS — Z781 Physical restraint status: Secondary | ICD-10-CM

## 2021-11-06 DIAGNOSIS — E874 Mixed disorder of acid-base balance: Secondary | ICD-10-CM | POA: Diagnosis present

## 2021-11-06 DIAGNOSIS — Z7189 Other specified counseling: Secondary | ICD-10-CM | POA: Diagnosis not present

## 2021-11-06 DIAGNOSIS — J690 Pneumonitis due to inhalation of food and vomit: Secondary | ICD-10-CM | POA: Diagnosis present

## 2021-11-06 DIAGNOSIS — A4151 Sepsis due to Escherichia coli [E. coli]: Principal | ICD-10-CM | POA: Diagnosis present

## 2021-11-06 DIAGNOSIS — J9602 Acute respiratory failure with hypercapnia: Secondary | ICD-10-CM | POA: Diagnosis not present

## 2021-11-06 DIAGNOSIS — E872 Acidosis, unspecified: Secondary | ICD-10-CM | POA: Diagnosis present

## 2021-11-06 DIAGNOSIS — E871 Hypo-osmolality and hyponatremia: Secondary | ICD-10-CM | POA: Diagnosis not present

## 2021-11-06 DIAGNOSIS — E876 Hypokalemia: Secondary | ICD-10-CM | POA: Diagnosis present

## 2021-11-06 DIAGNOSIS — H1030 Unspecified acute conjunctivitis, unspecified eye: Secondary | ICD-10-CM | POA: Diagnosis present

## 2021-11-06 DIAGNOSIS — R7401 Elevation of levels of liver transaminase levels: Secondary | ICD-10-CM | POA: Diagnosis not present

## 2021-11-06 DIAGNOSIS — R1310 Dysphagia, unspecified: Secondary | ICD-10-CM | POA: Diagnosis present

## 2021-11-06 DIAGNOSIS — J918 Pleural effusion in other conditions classified elsewhere: Secondary | ICD-10-CM | POA: Diagnosis present

## 2021-11-06 DIAGNOSIS — J9382 Other air leak: Secondary | ICD-10-CM | POA: Diagnosis not present

## 2021-11-06 DIAGNOSIS — Z20822 Contact with and (suspected) exposure to covid-19: Secondary | ICD-10-CM | POA: Diagnosis not present

## 2021-11-06 DIAGNOSIS — J90 Pleural effusion, not elsewhere classified: Secondary | ICD-10-CM | POA: Diagnosis not present

## 2021-11-06 DIAGNOSIS — Z91040 Latex allergy status: Secondary | ICD-10-CM

## 2021-11-06 DIAGNOSIS — Z8701 Personal history of pneumonia (recurrent): Secondary | ICD-10-CM

## 2021-11-06 DIAGNOSIS — I509 Heart failure, unspecified: Secondary | ICD-10-CM | POA: Diagnosis not present

## 2021-11-06 DIAGNOSIS — D696 Thrombocytopenia, unspecified: Secondary | ICD-10-CM | POA: Diagnosis not present

## 2021-11-06 DIAGNOSIS — R000 Tachycardia, unspecified: Secondary | ICD-10-CM | POA: Diagnosis present

## 2021-11-06 DIAGNOSIS — E162 Hypoglycemia, unspecified: Secondary | ICD-10-CM | POA: Diagnosis not present

## 2021-11-06 DIAGNOSIS — K56609 Unspecified intestinal obstruction, unspecified as to partial versus complete obstruction: Secondary | ICD-10-CM

## 2021-11-06 DIAGNOSIS — F79 Unspecified intellectual disabilities: Secondary | ICD-10-CM | POA: Diagnosis present

## 2021-11-06 DIAGNOSIS — Z66 Do not resuscitate: Secondary | ICD-10-CM | POA: Diagnosis not present

## 2021-11-06 DIAGNOSIS — J155 Pneumonia due to Escherichia coli: Secondary | ICD-10-CM | POA: Diagnosis not present

## 2021-11-06 DIAGNOSIS — E860 Dehydration: Secondary | ICD-10-CM | POA: Diagnosis present

## 2021-11-06 DIAGNOSIS — K567 Ileus, unspecified: Secondary | ICD-10-CM | POA: Diagnosis not present

## 2021-11-06 DIAGNOSIS — R739 Hyperglycemia, unspecified: Secondary | ICD-10-CM | POA: Diagnosis present

## 2021-11-06 DIAGNOSIS — R339 Retention of urine, unspecified: Secondary | ICD-10-CM | POA: Diagnosis not present

## 2021-11-06 DIAGNOSIS — A419 Sepsis, unspecified organism: Secondary | ICD-10-CM | POA: Diagnosis present

## 2021-11-06 DIAGNOSIS — Q6689 Other  specified congenital deformities of feet: Secondary | ICD-10-CM

## 2021-11-06 DIAGNOSIS — D6959 Other secondary thrombocytopenia: Secondary | ICD-10-CM | POA: Diagnosis present

## 2021-11-06 DIAGNOSIS — G804 Ataxic cerebral palsy: Secondary | ICD-10-CM | POA: Diagnosis present

## 2021-11-06 HISTORY — DX: Autistic disorder: F84.0

## 2021-11-06 HISTORY — DX: Other specified congenital deformities of feet: Q66.89

## 2021-11-06 LAB — URINALYSIS, ROUTINE W REFLEX MICROSCOPIC
Bilirubin Urine: NEGATIVE
Glucose, UA: NEGATIVE mg/dL
Ketones, ur: NEGATIVE mg/dL
Leukocytes,Ua: NEGATIVE
Nitrite: NEGATIVE
Protein, ur: 100 mg/dL — AB
Specific Gravity, Urine: 1.016 (ref 1.005–1.030)
pH: 5 (ref 5.0–8.0)

## 2021-11-06 LAB — COMPREHENSIVE METABOLIC PANEL
ALT: 45 U/L — ABNORMAL HIGH (ref 0–44)
AST: 84 U/L — ABNORMAL HIGH (ref 15–41)
Albumin: 3.2 g/dL — ABNORMAL LOW (ref 3.5–5.0)
Alkaline Phosphatase: 85 U/L (ref 38–126)
Anion gap: 23 — ABNORMAL HIGH (ref 5–15)
BUN: 72 mg/dL — ABNORMAL HIGH (ref 6–20)
CO2: 13 mmol/L — ABNORMAL LOW (ref 22–32)
Calcium: 7 mg/dL — ABNORMAL LOW (ref 8.9–10.3)
Chloride: 118 mmol/L — ABNORMAL HIGH (ref 98–111)
Creatinine, Ser: 5.53 mg/dL — ABNORMAL HIGH (ref 0.61–1.24)
GFR, Estimated: 13 mL/min — ABNORMAL LOW (ref >60)
Glucose, Bld: 108 mg/dL — ABNORMAL HIGH (ref 70–99)
Potassium: 4.2 mmol/L (ref 3.5–5.1)
Sodium: 154 mmol/L — ABNORMAL HIGH (ref 135–145)
Total Bilirubin: 1.3 mg/dL — ABNORMAL HIGH (ref 0.3–1.2)
Total Protein: 6.7 g/dL (ref 6.5–8.1)

## 2021-11-06 LAB — RESP PANEL BY RT-PCR (FLU A&B, COVID) ARPGX2
Influenza A by PCR: NEGATIVE
Influenza B by PCR: NEGATIVE
SARS Coronavirus 2 by RT PCR: NEGATIVE

## 2021-11-06 LAB — GLUCOSE, CAPILLARY
Glucose-Capillary: 77 mg/dL (ref 70–99)
Glucose-Capillary: 95 mg/dL (ref 70–99)

## 2021-11-06 LAB — CBC
HCT: 49.4 % (ref 39.0–52.0)
Hemoglobin: 16.1 g/dL (ref 13.0–17.0)
MCH: 31 pg (ref 26.0–34.0)
MCHC: 32.6 g/dL (ref 30.0–36.0)
MCV: 95.2 fL (ref 80.0–100.0)
Platelets: 143 10*3/uL — ABNORMAL LOW (ref 150–400)
RBC: 5.19 MIL/uL (ref 4.22–5.81)
RDW: 14.3 % (ref 11.5–15.5)
WBC: 4 10*3/uL (ref 4.0–10.5)
nRBC: 0 % (ref 0.0–0.2)

## 2021-11-06 LAB — I-STAT CHEM 8, ED
BUN: 79 mg/dL — ABNORMAL HIGH (ref 6–20)
Calcium, Ion: 0.83 mmol/L — CL (ref 1.15–1.40)
Chloride: 117 mmol/L — ABNORMAL HIGH (ref 98–111)
Creatinine, Ser: 5.9 mg/dL — ABNORMAL HIGH (ref 0.61–1.24)
Glucose, Bld: 115 mg/dL — ABNORMAL HIGH (ref 70–99)
HCT: 55 % — ABNORMAL HIGH (ref 39.0–52.0)
Hemoglobin: 18.7 g/dL — ABNORMAL HIGH (ref 13.0–17.0)
Potassium: 4 mmol/L (ref 3.5–5.1)
Sodium: 153 mmol/L — ABNORMAL HIGH (ref 135–145)
TCO2: 22 mmol/L (ref 22–32)

## 2021-11-06 LAB — POCT I-STAT 7, (LYTES, BLD GAS, ICA,H+H)
Acid-base deficit: 9 mmol/L — ABNORMAL HIGH (ref 0.0–2.0)
Bicarbonate: 19.3 mmol/L — ABNORMAL LOW (ref 20.0–28.0)
Calcium, Ion: 0.95 mmol/L — ABNORMAL LOW (ref 1.15–1.40)
HCT: 45 % (ref 39.0–52.0)
Hemoglobin: 15.3 g/dL (ref 13.0–17.0)
O2 Saturation: 94 %
Patient temperature: 38
Potassium: 3.5 mmol/L (ref 3.5–5.1)
Sodium: 155 mmol/L — ABNORMAL HIGH (ref 135–145)
TCO2: 21 mmol/L — ABNORMAL LOW (ref 22–32)
pCO2 arterial: 49.8 mmHg — ABNORMAL HIGH (ref 32–48)
pH, Arterial: 7.201 — ABNORMAL LOW (ref 7.35–7.45)
pO2, Arterial: 91 mmHg (ref 83–108)

## 2021-11-06 LAB — CBC WITH DIFFERENTIAL/PLATELET
Abs Immature Granulocytes: 3.3 10*3/uL — ABNORMAL HIGH (ref 0.00–0.07)
Band Neutrophils: 30 %
Basophils Absolute: 0 10*3/uL (ref 0.0–0.1)
Basophils Relative: 0 %
Eosinophils Absolute: 0 10*3/uL (ref 0.0–0.5)
Eosinophils Relative: 0 %
HCT: 56.6 % — ABNORMAL HIGH (ref 39.0–52.0)
Hemoglobin: 18.3 g/dL — ABNORMAL HIGH (ref 13.0–17.0)
Lymphocytes Relative: 10 %
Lymphs Abs: 1.1 10*3/uL (ref 0.7–4.0)
MCH: 31.1 pg (ref 26.0–34.0)
MCHC: 32.3 g/dL (ref 30.0–36.0)
MCV: 96.3 fL (ref 80.0–100.0)
Metamyelocytes Relative: 17 %
Monocytes Absolute: 1 10*3/uL (ref 0.1–1.0)
Monocytes Relative: 9 %
Myelocytes: 9 %
Neutro Abs: 5.7 10*3/uL (ref 1.7–7.7)
Neutrophils Relative %: 21 %
Platelets: 212 10*3/uL (ref 150–400)
Promyelocytes Relative: 4 %
RBC: 5.88 MIL/uL — ABNORMAL HIGH (ref 4.22–5.81)
RDW: 14.4 % (ref 11.5–15.5)
WBC: 11.1 10*3/uL — ABNORMAL HIGH (ref 4.0–10.5)
nRBC: 0 % (ref 0.0–0.2)

## 2021-11-06 LAB — BASIC METABOLIC PANEL
Anion gap: 11 (ref 5–15)
BUN: 59 mg/dL — ABNORMAL HIGH (ref 6–20)
CO2: 20 mmol/L — ABNORMAL LOW (ref 22–32)
Calcium: 6.2 mg/dL — CL (ref 8.9–10.3)
Chloride: 122 mmol/L — ABNORMAL HIGH (ref 98–111)
Creatinine, Ser: 3.83 mg/dL — ABNORMAL HIGH (ref 0.61–1.24)
GFR, Estimated: 20 mL/min — ABNORMAL LOW (ref >60)
Glucose, Bld: 95 mg/dL (ref 70–99)
Potassium: 3.5 mmol/L (ref 3.5–5.1)
Sodium: 153 mmol/L — ABNORMAL HIGH (ref 135–145)

## 2021-11-06 LAB — PROTIME-INR
INR: 1.9 — ABNORMAL HIGH (ref 0.8–1.2)
Prothrombin Time: 21.2 seconds — ABNORMAL HIGH (ref 11.4–15.2)

## 2021-11-06 LAB — BLOOD GAS, ARTERIAL
Acid-base deficit: 10.2 mmol/L — ABNORMAL HIGH (ref 0.0–2.0)
Bicarbonate: 20.5 mmol/L (ref 20.0–28.0)
Drawn by: 442
FIO2: 100 %
O2 Saturation: 97.6 %
Patient temperature: 37
pCO2 arterial: 66 mmHg (ref 32–48)
pH, Arterial: 7.1 — CL (ref 7.35–7.45)
pO2, Arterial: 125 mmHg — ABNORMAL HIGH (ref 83–108)

## 2021-11-06 LAB — APTT: aPTT: 40 seconds — ABNORMAL HIGH (ref 24–36)

## 2021-11-06 LAB — MAGNESIUM: Magnesium: 2.1 mg/dL (ref 1.7–2.4)

## 2021-11-06 LAB — LACTIC ACID, PLASMA
Lactic Acid, Venous: 3.5 mmol/L (ref 0.5–1.9)
Lactic Acid, Venous: 2.2 mmol/L (ref 0.5–1.9)

## 2021-11-06 LAB — AMMONIA: Ammonia: 38 umol/L — ABNORMAL HIGH (ref 9–35)

## 2021-11-06 LAB — STREP PNEUMONIAE URINARY ANTIGEN: Strep Pneumo Urinary Antigen: NEGATIVE

## 2021-11-06 LAB — HIV ANTIBODY (ROUTINE TESTING W REFLEX): HIV Screen 4th Generation wRfx: NONREACTIVE

## 2021-11-06 LAB — PHOSPHORUS: Phosphorus: 6 mg/dL — ABNORMAL HIGH (ref 2.5–4.6)

## 2021-11-06 LAB — MRSA NEXT GEN BY PCR, NASAL: MRSA by PCR Next Gen: NOT DETECTED

## 2021-11-06 LAB — PROCALCITONIN: Procalcitonin: >150 ng/mL

## 2021-11-06 MED ORDER — FENTANYL CITRATE (PF) 100 MCG/2ML IJ SOLN
100.0000 ug | Freq: Once | INTRAMUSCULAR | Status: AC
  Administered 2021-11-06: 100 ug via INTRAVENOUS

## 2021-11-06 MED ORDER — POLYMYXIN B-TRIMETHOPRIM 10000-0.1 UNIT/ML-% OP SOLN
2.0000 [drp] | Freq: Four times a day (QID) | OPHTHALMIC | Status: AC
  Administered 2021-11-07 – 2021-11-13 (×28): 2 [drp] via OPHTHALMIC
  Filled 2021-11-06: qty 10

## 2021-11-06 MED ORDER — POLYETHYLENE GLYCOL 3350 17 G PO PACK: 17.0000 g | PACK | Freq: Every day | ORAL | Status: DC

## 2021-11-06 MED ORDER — MIDAZOLAM HCL 2 MG/2ML IJ SOLN
INTRAMUSCULAR | Status: AC
  Filled 2021-11-06: qty 2

## 2021-11-06 MED ORDER — SODIUM CHLORIDE 0.9 % IV SOLN
2.0000 g | INTRAVENOUS | Status: DC
  Administered 2021-11-06 – 2021-11-07 (×2): 2 g via INTRAVENOUS
  Filled 2021-11-06 (×2): qty 20

## 2021-11-06 MED ORDER — SODIUM CHLORIDE 0.9 % IV SOLN: 2.0000 g | INTRAVENOUS | Status: DC

## 2021-11-06 MED ORDER — MIDAZOLAM HCL 2 MG/2ML IJ SOLN: 2.0000 mg | INTRAMUSCULAR | Status: DC | PRN

## 2021-11-06 MED ORDER — SODIUM CHLORIDE 0.9 % IV BOLUS (SEPSIS)
1000.0000 mL | Freq: Once | INTRAVENOUS | Status: AC
  Administered 2021-11-06: 1000 mL via INTRAVENOUS

## 2021-11-06 MED ORDER — FENTANYL CITRATE PF 50 MCG/ML IJ SOSY: 50.0000 ug | PREFILLED_SYRINGE | INTRAMUSCULAR | Status: DC | PRN

## 2021-11-06 MED ORDER — ORAL CARE MOUTH RINSE: 15.0000 mL | OROMUCOSAL | Status: DC | PRN

## 2021-11-06 MED ORDER — SODIUM CHLORIDE 0.9 % IV SOLN
2.0000 g | Freq: Once | INTRAVENOUS | Status: AC
  Administered 2021-11-06: 2 g via INTRAVENOUS
  Filled 2021-11-06: qty 12.5

## 2021-11-06 MED ORDER — SODIUM CHLORIDE 0.9 % IV SOLN
500.0000 mg | INTRAVENOUS | Status: DC
  Administered 2021-11-06 – 2021-11-07 (×2): 500 mg via INTRAVENOUS
  Filled 2021-11-06 (×2): qty 5

## 2021-11-06 MED ORDER — FENTANYL CITRATE (PF) 100 MCG/2ML IJ SOLN
INTRAMUSCULAR | Status: AC
  Filled 2021-11-06: qty 2

## 2021-11-06 MED ORDER — SODIUM CHLORIDE 0.9 % IV BOLUS
500.0000 mL | Freq: Once | INTRAVENOUS | Status: AC
Start: 2021-11-06 — End: 2021-11-06
  Administered 2021-11-06: 500 mL via INTRAVENOUS

## 2021-11-06 MED ORDER — ROCURONIUM BROMIDE 50 MG/5ML IV SOLN
1.0000 mg/kg | Freq: Once | INTRAVENOUS | Status: AC
  Filled 2021-11-06: qty 7.48

## 2021-11-06 MED ORDER — ORAL CARE MOUTH RINSE
15.0000 mL | OROMUCOSAL | Status: DC
Start: 2021-11-06 — End: 2021-11-17
  Administered 2021-11-06 – 2021-11-17 (×118): 15 mL via OROMUCOSAL

## 2021-11-06 MED ORDER — METRONIDAZOLE 500 MG/100ML IV SOLN
500.0000 mg | Freq: Once | INTRAVENOUS | Status: AC
  Administered 2021-11-06: 500 mg via INTRAVENOUS
  Filled 2021-11-06: qty 100

## 2021-11-06 MED ORDER — FENTANYL CITRATE PF 50 MCG/ML IJ SOSY
PREFILLED_SYRINGE | INTRAMUSCULAR | Status: AC
  Filled 2021-11-06: qty 2

## 2021-11-06 MED ORDER — CALCIUM GLUCONATE-NACL 1-0.675 GM/50ML-% IV SOLN
1.0000 g | Freq: Once | INTRAVENOUS | Status: AC
  Administered 2021-11-06: 1000 mg via INTRAVENOUS
  Filled 2021-11-06: qty 50

## 2021-11-06 MED ORDER — ACETAMINOPHEN 650 MG RE SUPP
650.0000 mg | Freq: Once | RECTAL | Status: AC
Start: 2021-11-06 — End: 2021-11-06
  Administered 2021-11-06: 650 mg via RECTAL
  Filled 2021-11-06: qty 1

## 2021-11-06 MED ORDER — IPRATROPIUM-ALBUTEROL 0.5-2.5 (3) MG/3ML IN SOLN
3.0000 mL | Freq: Four times a day (QID) | RESPIRATORY_TRACT | Status: DC | PRN
Start: 2021-11-06 — End: 2021-11-15

## 2021-11-06 MED ORDER — CHLORHEXIDINE GLUCONATE CLOTH 2 % EX PADS
6.0000 | MEDICATED_PAD | Freq: Every day | CUTANEOUS | Status: DC
  Administered 2021-11-06 – 2021-11-26 (×23): 6 via TOPICAL

## 2021-11-06 MED ORDER — PANTOPRAZOLE SODIUM 40 MG IV SOLR
40.0000 mg | Freq: Every day | INTRAVENOUS | Status: DC
  Administered 2021-11-06: 40 mg via INTRAVENOUS
  Filled 2021-11-06: qty 10

## 2021-11-06 MED ORDER — LACTATED RINGERS IV SOLN: INTRAVENOUS | Status: AC

## 2021-11-06 MED ORDER — MIDAZOLAM HCL 2 MG/2ML IJ SOLN
1.0000 mg | Freq: Once | INTRAMUSCULAR | Status: AC
  Administered 2021-11-06: 1 mg via INTRAVENOUS

## 2021-11-06 MED ORDER — FENTANYL CITRATE PF 50 MCG/ML IJ SOSY
50.0000 ug | PREFILLED_SYRINGE | INTRAMUSCULAR | Status: DC | PRN
  Administered 2021-11-06: 50 ug via INTRAVENOUS
  Administered 2021-11-11: 100 ug via INTRAVENOUS
  Administered 2021-11-11: 200 ug via INTRAVENOUS
  Administered 2021-11-11: 150 ug via INTRAVENOUS
  Administered 2021-11-11 (×2): 100 ug via INTRAVENOUS
  Administered 2021-11-11 (×2): 50 ug via INTRAVENOUS
  Administered 2021-11-11: 200 ug via INTRAVENOUS
  Administered 2021-11-11: 100 ug via INTRAVENOUS
  Administered 2021-11-11: 200 ug via INTRAVENOUS
  Administered 2021-11-15 (×2): 50 ug via INTRAVENOUS
  Filled 2021-11-06: qty 4
  Filled 2021-11-06: qty 3
  Filled 2021-11-06: qty 1
  Filled 2021-11-06: qty 2
  Filled 2021-11-06 (×2): qty 4
  Filled 2021-11-06: qty 2
  Filled 2021-11-06 (×2): qty 1
  Filled 2021-11-06 (×2): qty 2
  Filled 2021-11-06 (×2): qty 1

## 2021-11-06 MED ORDER — ROCURONIUM BROMIDE 10 MG/ML (PF) SYRINGE
PREFILLED_SYRINGE | INTRAVENOUS | Status: AC
  Administered 2021-11-06: 74.8 mg via INTRAVENOUS
  Filled 2021-11-06: qty 10

## 2021-11-06 MED ORDER — SODIUM CHLORIDE 0.9 % IV BOLUS
1000.0000 mL | Freq: Once | INTRAVENOUS | Status: AC
  Administered 2021-11-06: 1000 mL via INTRAVENOUS

## 2021-11-06 MED ORDER — FENTANYL CITRATE PF 50 MCG/ML IJ SOSY
50.0000 ug | PREFILLED_SYRINGE | INTRAMUSCULAR | Status: AC | PRN
  Administered 2021-11-10 – 2021-11-15 (×3): 50 ug via INTRAVENOUS
  Filled 2021-11-06 (×3): qty 1

## 2021-11-06 MED ORDER — HEPARIN SODIUM (PORCINE) 5000 UNIT/ML IJ SOLN
5000.0000 [IU] | Freq: Three times a day (TID) | INTRAMUSCULAR | Status: DC
Start: 2021-11-06 — End: 2021-11-09
  Administered 2021-11-06 – 2021-11-09 (×8): 5000 [IU] via SUBCUTANEOUS
  Filled 2021-11-06 (×8): qty 1

## 2021-11-06 MED ORDER — DOCUSATE SODIUM 50 MG/5ML PO LIQD
100.0000 mg | Freq: Two times a day (BID) | ORAL | Status: DC
  Administered 2021-11-07 – 2021-11-08 (×2): 100 mg
  Filled 2021-11-06 (×2): qty 10

## 2021-11-06 MED ORDER — SODIUM CHLORIDE 0.9 % IV BOLUS (SEPSIS)
250.0000 mL | Freq: Once | INTRAVENOUS | Status: AC
  Administered 2021-11-06: 250 mL via INTRAVENOUS

## 2021-11-06 MED ORDER — ETOMIDATE 2 MG/ML IV SOLN
20.0000 mg | Freq: Once | INTRAVENOUS | Status: AC
  Administered 2021-11-06: 20 mg via INTRAVENOUS
  Filled 2021-11-06: qty 10

## 2021-11-06 NOTE — Progress Notes (Signed)
eLink Physician-Brief Progress Note Patient Name: DEWARREN LEDBETTER DOB: 1990/12/03 MRN: [id number]   Date of Service  11-14-21  HPI/Events of Note  Calcium 6.2 (Corrected 6.8)  eICU Interventions  Calcium gluconate 1 g IVPB ordered     Intervention Category Intermediate Interventions: Electrolyte abnormality - evaluation and management  Darl Pikes November 14, 2021, 11:41 PM

## 2021-11-06 NOTE — ED Provider Notes (Signed)
[facility] EMERGENCY DEPARTMENT Provider Note   CSN: [id number] Arrival date & time: 12/02/2021  1511     History  Chief Complaint  Patient presents with   Respiratory Distress    Todd Snyder is a 31 y.o. male.  HPI Patient has a history of autism according to EMS.  Patient is unable to respond on arrival in the emergency department.  According to EMS report patient was having difficulty with throwing up and not eating since Friday.  On EMS arrival the patient was noted to have weak pulses respiratory distress tachycardia.  Patient required bag-valve-mask ventilation.  Nasal airway was placed.  He was placed on a nonrebreather emergently transferred to the ED    Home Medications Prior to Admission medications   Not on File      Allergies    Latex    Review of Systems   Review of Systems  Physical Exam Updated Vital Signs BP 114/62   Pulse (!) 167   Temp 99.9 F (37.7 C)   Resp 20   Ht 1.676 m (5\' 6" )   Wt 74.8 kg   SpO2 98%   BMI 26.63 kg/m  Physical Exam Vitals and nursing note reviewed.  Constitutional:      General: He is in acute distress.     Appearance: He is well-developed. He is ill-appearing and toxic-appearing.  HENT:     Head: Normocephalic and atraumatic.     Right Ear: External ear normal.     Left Ear: External ear normal.     Mouth/Throat:     Mouth: Mucous membranes are dry.     Comments: Dried emesis in the oropharynx, mucous membranes dry Eyes:     General: No scleral icterus.       Right eye: No discharge.        Left eye: Discharge present.    Comments: Erythematous conjunctive of the left eye  Neck:     Trachea: No tracheal deviation.  Cardiovascular:     Rate and Rhythm: Regular rhythm. Tachycardia present.  Pulmonary:     Effort: Respiratory distress present.     Breath sounds: No stridor. Rhonchi and rales present. No wheezing.  Abdominal:     General: Bowel sounds are normal.     Palpations: Abdomen is soft.      Tenderness: There is no abdominal tenderness. There is no guarding or rebound.  Musculoskeletal:        General: No tenderness or deformity.     Cervical back: Neck supple.  Skin:    General: Skin is warm and dry.     Findings: No rash.  Neurological:     GCS: GCS eye subscore is 4. GCS verbal subscore is 1. GCS motor subscore is 2.     Motor: No abnormal muscle tone or seizure activity.     Comments: Patient nonverbal, lower extremities appear to be atrophic  Psychiatric:        Mood and Affect: Mood normal.     ED Results / Procedures / Treatments   Labs (all labs ordered are listed, but only abnormal results are displayed) Labs Reviewed  LACTIC ACID, PLASMA - Abnormal; Notable for the following components:      Result Value   Lactic Acid, Venous 3.5 (*)    All other components within normal limits  COMPREHENSIVE METABOLIC PANEL - Abnormal; Notable for the following components:   Sodium 154 (*)    Chloride 118 (*)    CO2  13 (*)    Glucose, Bld 108 (*)    BUN 72 (*)    Creatinine, Ser 5.53 (*)    Calcium 7.0 (*)    Albumin 3.2 (*)    AST 84 (*)    ALT 45 (*)    Total Bilirubin 1.3 (*)    GFR, Estimated 13 (*)    Anion gap 23 (*)    All other components within normal limits  CBC WITH DIFFERENTIAL/PLATELET - Abnormal; Notable for the following components:   WBC 11.1 (*)    RBC 5.88 (*)    Hemoglobin 18.3 (*)    HCT 56.6 (*)    Abs Immature Granulocytes 3.30 (*)    All other components within normal limits  BLOOD GAS, ARTERIAL - Abnormal; Notable for the following components:   pH, Arterial 7.1 (*)    pCO2 arterial 66 (*)    pO2, Arterial 125 (*)    Acid-base deficit 10.2 (*)    All other components within normal limits  PROTIME-INR - Abnormal; Notable for the following components:   Prothrombin Time 21.2 (*)    INR 1.9 (*)    All other components within normal limits  APTT - Abnormal; Notable for the following components:   aPTT 40 (*)    All other components  within normal limits  I-STAT CHEM 8, ED - Abnormal; Notable for the following components:   Sodium 153 (*)    Chloride 117 (*)    BUN 79 (*)    Creatinine, Ser 5.90 (*)    Glucose, Bld 115 (*)    Calcium, Ion 0.83 (*)    Hemoglobin 18.7 (*)    HCT 55.0 (*)    All other components within normal limits  RESP PANEL BY RT-PCR (FLU A&B, COVID) ARPGX2  CULTURE, BLOOD (ROUTINE X 2)  CULTURE, BLOOD (ROUTINE X 2)  URINE CULTURE  LACTIC ACID, PLASMA  URINALYSIS, ROUTINE W REFLEX MICROSCOPIC    EKG EKG Interpretation  Date/Time:  Sunday 23-Nov-2021 15:16:49 EDT Ventricular Rate:  181 PR Interval:  139 QRS Duration: 111 QT Interval:  272 QTC Calculation: 472 R Axis:   83 Text Interpretation: Sinus tachycardia Probable anteroseptal infarct, recent Confirmed by Linwood Dibbles [phone]) on Nov 23, 2021 3:34:48 PM  Radiology DG Abdomen 1 View  Result Date: 23-Nov-2021 CLINICAL DATA:  Altered mental status and weak pulse. EXAM: ABDOMEN - 1 VIEW COMPARISON:  Radiograph November 15, 2009. FINDINGS: Nasogastric tube with tip and side port projecting over the stomach. Dilated loops of proximal small bowel measure up to 4.1 cm. IMPRESSION: 1. Radiographic findings consistent with small bowel obstruction suggest further evaluation with CT abdomen and pelvis. 2. Nasogastric tube with tip and side port projecting over the stomach. Electronically Signed   By: Maudry Mayhew M.D.   On: Nov 23, 2021 16:59   DG Chest Port 1 View  Result Date: Nov 23, 2021 CLINICAL DATA:  Questionable sepsis. EXAM: PORTABLE CHEST 1 VIEW COMPARISON:  Chest x-ray 11/25/2009 FINDINGS: Endotracheal tube tip is 4.5 cm above the carina. Right-sided central venous catheter tip projects over the proximal SVC. Nasogastric tube is coiled in the fundus of the stomach. There is a moderate-sized left pleural effusion. There is patchy left mid and lower lung airspace consolidation. Right lung is clear. There is no pneumothorax. The cardiomediastinal  silhouette is within normal limits. No acute fractures are seen. IMPRESSION: 1. Left mid and lower lung airspace consolidation. 2. Moderate left pleural effusion. 3. Lines and tubes as above. Electronically Signed  By: Ronney Asters M.D.   On: 10/30/2021 16:52    Procedures .Critical Care  Performed by: Dorie Rank, MD Authorized by: Dorie Rank, MD   Critical care provider statement:    Critical care time (minutes):  30   Critical care was time spent personally by me on the following activities:  Development of treatment plan with patient or surrogate, discussions with consultants, evaluation of patient's response to treatment, examination of patient, ordering and review of laboratory studies, ordering and review of radiographic studies, ordering and performing treatments and interventions, pulse oximetry, re-evaluation of patient's condition and review of old charts Procedure Name: Intubation Date/Time: 10/30/2021 4:26 PM  Performed by: Dorie Rank, MDPre-anesthesia Checklist: Patient identified, Patient being monitored, Emergency Drugs available, Timeout performed and Suction available Oxygen Delivery Method: Non-rebreather mask Preoxygenation: Pre-oxygenation with 100% oxygen Induction Type: Rapid sequence Ventilation: Mask ventilation without difficulty Laryngoscope Size: Glidescope Grade View: Grade II Tube size: 7.5 mm Number of attempts: 1 Airway Equipment and Method: Video-laryngoscopy Placement Confirmation: ETT inserted through vocal cords under direct vision, CO2 detector and Breath sounds checked- equal and bilateral Dental Injury: Teeth and Oropharynx as per pre-operative assessment     .Central Line  Date/Time: 11/04/2021 4:26 PM  Performed by: Dorie Rank, MD Authorized by: Dorie Rank, MD   Consent:    Consent obtained:  Emergent situation Universal protocol:    Procedure explained and questions answered to patient or proxy's satisfaction: yes     Immediately prior to  procedure, a time out was called: yes     Patient identity confirmed:  Hospital-assigned identification number Pre-procedure details:    Indication(s): central venous access and insufficient peripheral access     Hand hygiene: Hand hygiene performed prior to insertion     Sterile barrier technique: All elements of maximal sterile technique followed     Skin preparation:  Chlorhexidine Sedation:    Sedation type:  None Anesthesia:    Anesthesia method:  None Procedure details:    Location:  R internal jugular   Procedural supplies:  Triple lumen   Ultrasound guidance: yes     Ultrasound guidance timing: real time     Sterile ultrasound techniques: Sterile gel and sterile probe covers were used     Number of attempts:  1   Successful placement: yes   Post-procedure details:    Post-procedure:  Dressing applied   Assessment:  Blood return through all ports and free fluid flow   Procedure completion:  Tolerated well, no immediate complications     Medications Ordered in ED Medications  fentaNYL (SUBLIMAZE) injection 50 mcg (has no administration in time range)  fentaNYL (SUBLIMAZE) injection 50-200 mcg (has no administration in time range)  midazolam (VERSED) injection 2 mg (has no administration in time range)  midazolam (VERSED) injection 2 mg (has no administration in time range)  lactated ringers infusion ( Intravenous New Bag/Given 11/02/2021 1713)  ceFEPIme (MAXIPIME) 2 g in sodium chloride 0.9 % 100 mL IVPB (has no administration in time range)  etomidate (AMIDATE) injection 20 mg (20 mg Intravenous Given 11/18/2021 1524)  rocuronium (ZEMURON) injection 74.8 mg (74.8 mg Intravenous Given 11/02/2021 1524)  fentaNYL (SUBLIMAZE) injection 100 mcg (100 mcg Intravenous Given 11/19/2021 1532)  sodium chloride 0.9 % bolus 1,000 mL (0 mLs Intravenous Stopped 11/21/2021 1636)    And  sodium chloride 0.9 % bolus 1,000 mL (0 mLs Intravenous Stopped 10/27/2021 1707)    And  sodium chloride 0.9 % bolus  250 mL (250  mLs Intravenous New Bag/Given 11/09/2021 1719)  ceFEPIme (MAXIPIME) 2 g in sodium chloride 0.9 % 100 mL IVPB (0 g Intravenous Stopped 11/18/2021 1707)  metroNIDAZOLE (FLAGYL) IVPB 500 mg (0 mg Intravenous Stopped 11/15/2021 1707)  sodium chloride 0.9 % bolus 1,000 mL (0 mLs Intravenous Stopped 10/31/2021 1707)  acetaminophen (TYLENOL) suppository 650 mg (650 mg Rectal Given 10/25/2021 1718)    ED Course/ Medical Decision Making/ A&P Clinical Course as of 11/24/2021 1740  Sun Nov 06, 2021  1627 Patient febrile to 102.4.  Sepsis protocol initiated.  Heart rate decreasing somewhat with fluids [JK]  1701 Comprehensive metabolic panel(!) Hyponatremia noted.  Labs consistent with acute renal failure [JK]  1701 Resp Panel by RT-PCR (Flu A&B, Covid) Anterior Nasal Swab Negative [JK]  1701 Blood gas, arterial(!!) Combination of respiratory and metabolic acidosis.  Patient is intubated.  Will ask RT to increase ventilation  [JK]  1702 DG Chest Port 1 View Chest x-ray shows pneumonia and pleural effusion [JK]  1703 DG Abdomen 1 View Abdominal x-ray suggest small bowel obstruction [JK]  1732 DIscussed with Dr Delton Coombes [JK]    Clinical Course User Index [JK] Linwood Dibbles, MD                           Medical Decision Making Problems Addressed: Acute renal failure, unspecified acute renal failure type [facility]): acute illness or injury that poses a threat to life or bodily functions Intestinal obstruction, unspecified cause, unspecified whether partial or complete [facility]): acute illness or injury that poses a threat to life or bodily functions Pneumonia of left lung due to infectious organism, unspecified part of lung: acute illness or injury that poses a threat to life or bodily functions  Amount and/or Complexity of Data Reviewed Labs: ordered. Decision-making details documented in ED Course. Radiology: ordered and independent interpretation performed. Decision-making details documented in ED  Course. ECG/medicine tests: ordered.  Risk OTC drugs. Prescription drug management. Decision regarding hospitalization.   Patient presented to ED for evaluation of altered mental status, vomiting, tachycardia hypotension.  Patient critically ill on arrival.  He was in shock.  Patient was started on broad-spectrum antibiotics and IV fluid resuscitation.  Presentation consistent with sepsis.  Patient noted to be extremely tachycardic.  Most consistent with a sinus tachycardia.  We will continue with IV fluid resuscitation.  Hold on any rate controlling meds right now.  Labs do show acute renal failure.  He also has pneumonia and a bowel obstruction.  We will proceed with CT scan to evaluate his bowel obstruction further.  NG tube has been placed.  ABG does show persistent respiratory and metabolic acidosis.  Will increase his ventilation and reassess ABG.  Case has been discussed with critical care Dr. Delton Coombes at [facility].  We will plan on transfer there for further treatment and evaluation.  His father is at the bedside.  I did discuss the findings with him and the critical nature of his son's illness.  Patient is full code at this time although the father states he will think on this further        Final Clinical Impression(s) / ED Diagnoses Final diagnoses:  Sepsis with acute renal failure and septic shock, due to unspecified organism, unspecified acute renal failure type (HCC)  Pneumonia of left lung due to infectious organism, unspecified part of lung  Acute renal failure, unspecified acute renal failure type (HCC)  Intestinal obstruction, unspecified cause, unspecified whether partial or  complete [facility])    Rx / DC Orders ED Discharge Orders     None         Dorie Rank, MD 11/02/2021 1740

## 2021-11-06 NOTE — H&P (Addendum)
NAME:  Todd Snyder, MRN:  [id number], DOB:  17-Dec-1990, LOS: 0 ADMISSION DATE:  12-05-2021,  CHIEF COMPLAINT:  Dyspnea, weakness  History of Present Illness:  66M with a hx of autism. Was well until 2 days prior to admission when he was noted to have nausea, vomiting, poor PO intake. Experienced progressive weakness, tachypnea, tachycardia. EMS was call and he was found in respiratory distress.  In ED required intubation and MV. Eval revealed LLL PNA, acute renal failure. Also question ileus vs bowel obstruction. Transfers to [facility] with respiratory failure, septic shock and acute renal failure.   Pertinent  Medical History   Past Medical History:  Diagnosis Date   Autism    Bilateral club feet     Significant Hospital Events: Including procedures, antibiotic start and stop dates in addition to other pertinent events   CT abdomen pelvis 8/13 >>   Interim History / Subjective:  Intubated Recently given 50 fentanyl during transfer; sedate; pupils 2 mm sluggish to light; weak cough gag Tachy 150s appears sinus BP stable  Objective   Blood pressure 114/62, pulse (!) 166, temperature 99.9 F (37.7 C), resp. rate (!) 24, height 5\' 6"  (1.676 m), weight 74.8 kg, SpO2 (!) 88 %.    Vent Mode: PRVC FiO2 (%):  [100 %] 100 % Set Rate:  [20 bmp-24 bmp] 24 bmp Vt Set:  [380 mL-500 mL] 380 mL PEEP:  [5 cmH20] 5 cmH20 Plateau Pressure:  [25 cmH20] 25 cmH20  No intake or output data in the 24 hours ending 12/05/2021 1811 Filed Weights   2021/12/05 1520  Weight: 74.8 kg    Examination: General:  critically ill young appearing male on mech vent HEENT: MM pink/moist; ETT in place; Left eye drainage with erythema Neuro: sedated; pupils 2 mm sluggish to light; weak cough/gag CV: s1s2, tachy 150s, no m/r/g PULM:  dim BS bilaterally; on mech vent PRVC GI: soft, bsx4 diminished Extremities: warm/dry, no edema Skin: no rashes or lesions appreciated  Resolved Hospital Problem list      Assessment & Plan:   LLL Pneumonia: likely aspiration Sepsis Tachycardia: appears Normal sinus; likely dehydration P: -admit to icu -continue IV fluids -trend LA -follow bcx2 and uc; send resp culture -azithromycin and rocephin for pneumonia -trend wbc/fever curve -check bmp and mag -BP stable; consider metoprolol if tachycardia persists despite iv fluids  Acute respiratory failure with hypoxia/hypercarbia due to the above P: -LTVV strategy with tidal volumes of 6-8 cc/kg ideal body weight -check ABG and adjust settings accordingly -check CXR on patient arrival -Wean PEEP/FiO2 for SpO2 >92% -VAP bundle in place -Daily SAT and SBT -PAD protocol in place -wean sedation for RASS goal 0 to -1 -Follow intermittent CXR and ABG PRN  Acute encephalopathy: likely in setting of sepsis and hypercarbia Hx of ataxic cerebral palsy and autism: baseline nonverbal; communicates with hand signals P: -limit sedating meds -vent adjusted for paco2 goal 35-45 -cont abx as above -consider CT head if no improvement  Acute renal failure AG metabolic acidosis P: -Trend BMP / urinary output -Replace electrolytes as indicated -Avoid nephrotoxic agents, ensure adequate renal perfusion  Hypernatremia -likely dehydration related given recent N/V and decreased appetite P: -continue iv fluids -check bmp; avoid decreasing na > 10 meq in 24 hours -consider d5 fluids if persistently elevated -trend bmp  Ileus vs Bowel obstruction P: -NPO -OG on low intermittent suction -monitor for BM  Left bacterial vs. Viral conjunctivitis P: -Polytrim 2 drops in left eye q6  for 7 days  Mild transaminitis: likely in setting of shock P: -trend cmp  Best Practice (right click and "Reselect all SmartList Selections" daily)   Diet/type: NPO DVT prophylaxis: prophylactic heparin  GI prophylaxis: PPI Lines: N/A Foley:  N/A Code Status:  full code Last date of multidisciplinary goals of care  discussion [8/13 spoke with father over phone. State patient is non verbal at baseline but able to make some signs with hands to communicate. Lives in [place]. ]  Labs   CBC: Recent Labs  Lab 11/16/2021 1524 11/15/2021 1534  WBC  --  11.1*  NEUTROABS  --  5.7  HGB 18.7* 18.3*  HCT 55.0* 56.6*  MCV  --  96.3  PLT  --  212    Basic Metabolic Panel: Recent Labs  Lab 11/15/2021 1524 11/05/2021 1534  NA 153* 154*  K 4.0 4.2  CL 117* 118*  CO2  --  13*  GLUCOSE 115* 108*  BUN 79* 72*  CREATININE 5.90* 5.53*  CALCIUM  --  7.0*   GFR: Estimated Creatinine Clearance: 17.1 mL/min (A) (by C-G formula based on SCr of 5.53 mg/dL (H)). Recent Labs  Lab 10/29/2021 1534 11/11/2021 1600  WBC 11.1*  --   LATICACIDVEN  --  3.5*    Liver Function Tests: Recent Labs  Lab 11/19/2021 1534  AST 84*  ALT 45*  ALKPHOS 85  BILITOT 1.3*  PROT 6.7  ALBUMIN 3.2*   No results for input(s): "LIPASE", "AMYLASE" in the last 168 hours. No results for input(s): "AMMONIA" in the last 168 hours.  ABG    Component Value Date/Time   PHART 7.1 (LL) 11/24/2021 1625   PCO2ART 66 (HH) 10/26/2021 1625   PO2ART 125 (H) 10/31/2021 1625   HCO3 20.5 11/09/2021 1625   TCO2 22 10/30/2021 1524   ACIDBASEDEF 10.2 (H) 10/28/2021 1625   O2SAT 97.6 11/16/2021 1625     Coagulation Profile: Recent Labs  Lab 11/24/2021 1613  INR 1.9*    Cardiac Enzymes: No results for input(s): "CKTOTAL", "CKMB", "CKMBINDEX", "TROPONINI" in the last 168 hours.  HbA1C: No results found for: "HGBA1C"  CBG: No results for input(s): "GLUCAP" in the last 168 hours.  Review of Systems:   Patient is encephalopathic and/or intubated. Therefore history has been obtained from chart review.    Past Medical History:  He,  has a past medical history of Autism and Bilateral club feet.   Surgical History:    Social History:      Family History:  His family history is not on file.   Allergies Allergies  Allergen Reactions    Latex      Home Medications  Prior to Admission medications   Not on File     Critical care time: 45 minutes    JD Anselm Lis [facility] 11/20/2021, 7:24 PM  Please see [email or web address] for pager details.  From 7A-7P if no response, please call [phone]. After hours, please call ELink [phone].

## 2021-11-06 NOTE — Progress Notes (Signed)
eLink Physician-Brief Progress Note Patient Name: Todd Snyder DOB: [birth year] MRN: [id number]   Date of Service  11/05/2021  HPI/Events of Note  26 M history of autism brought in by EMS for vomiting and decreased PO intake for the pas2-3 days. He was found to have AMS with weak pulses and in respiratory distress. Intubated in the ED. Abdominal CT with ileus/enteritis with possible early/partial small bowel obstruction, multifocal pneumonia.  eICU Interventions  Decompression with OGT Aspiration pneumonia on cefepime also received metronidazole in the ED AKI likely prerenal giving fluids. Received 4 liters in the ED CCM team at bedside     Intervention Category Evaluation Type: New Patient Evaluation  Darl Pikes 11/09/2021, 7:55 PM

## 2021-11-06 NOTE — Procedures (Signed)
Bronchoscopy Procedure Note  Todd Snyder  [id number]  [birth year]  Date:11/05/2021  Time:9:43 PM   Provider Performing:Brent Jaison Petraglia   Procedure(s):  Flexible bronchoscopy with bronchial alveolar lavage [phone]) Therapeutic aspiration of the left lower lobe.  Indication(s) Respiratory failure, pneumonia  Consent Risks of the procedure as well as the alternatives and risks of each were explained to the patient and/or caregiver.  Consent for the procedure was obtained and is signed in the bedside chart  Anesthesia Fentanyl 59mg IV, versed 141mIV   Time Out Verified patient identification, verified procedure, site/side was marked, verified correct patient position, special equipment/implants available, medications/allergies/relevant history reviewed, required imaging and test results available.   Sterile Technique Usual hand hygiene, masks, gowns, and gloves were used   Procedure Description Bronchoscope advanced through endotracheal tube and into airway.  Airways were examined down to subsegmental level with findings noted below.   Following diagnostic evaluation, BAL(s) performed in anteriomedial basal segment with normal saline and return of 14 cloudy, green fluid  Findings: The trachea was normal, the carina was sharp. The right tracheobronchial tree was inspected to the first subseegmental level and were normal without evidence of mass, secretions or airway lesion. The left tracheobronchial tree was inspected and the left mainstem and left upper lobe were normal.  However there was dark green mucus completely occluding the left lower lobe orifice.  This was suctioned and the airway was clear.  Then a BAL was performed as above   Complications/Tolerance None; patient tolerated the procedure well. Chest X-ray is needed post procedure.   EBL Minimal   Specimen(s) BAL LLL  BrRoselie AwkwardMD [place] [facility] Pager: [phone]: ([phone] 7:00 pm call  Elink  [phone]

## 2021-11-06 NOTE — ED Notes (Signed)
Respiratory therapist aware of dr request for them to assess pt d/t art. Blood gas.

## 2021-11-06 NOTE — ED Triage Notes (Signed)
Pt brought to ED via [facility], emergency traffic for respiratory distress. EMS called out for throwing up and not eating since Friday, pt found AMS, weak pulses, RR30, pulse 220, Bag mask ventilations given x 30-40 seconds, nasal airway placed by EMS. Pt on NR at 15 L. 500 ml NS given CBG 160

## 2021-11-06 NOTE — Progress Notes (Signed)
Pharmacy Antibiotic Note  Todd Snyder a 31 y.o. male admitted on 10-Nov-2021 with intra abdominal infection and sepsis.  Pharmacy has been consulted for cefepime dosing.  Plan: Cefepime 2gm IV every 24 hours.  Medical History: No past medical history on file.  Allergies:  Not on File  Filed Weights   11-10-2021 1520  Weight: 74.8 kg (165 lb)       Latest Ref Rng & Units 11-10-21    3:24 PM 12/01/2009    5:05 AM 11/27/2009    2:05 PM  CBC  WBC 4.0 - 10.5 K/uL  7.4  5.7   Hemoglobin 13.0 - 17.0 g/dL 50.3  88.8  9.1   Hematocrit 39.0 - 52.0 % 55.0  31.3  28.0   Platelets 150 - 400 K/uL  291  325      Estimated Creatinine Clearance: 16.1 mL/min (A) (by C-G formula based on SCr of 5.9 mg/dL (H)).  Antibiotics Given (last 72 hours)     None       Antimicrobials this admission:  Cefepime November 10, 2021  >>  Metronidazole 11/10/21 >>  Microbiology results: 11-10-2021  BCx: sent 11/10/21  UCx: sent Nov 10, 2021  Resp Panel: sent   Thank you for allowing pharmacy to be a part of this patient's care.  Luan Pulling, PharmD Clinical Pharmacist

## 2021-11-06 NOTE — ED Notes (Signed)
Pt intubated by Dr Lynelle Doctor x 1 attempt without difficulty with 7.5 ET tube 25 at teeth, +CO2 change

## 2021-11-06 NOTE — ED Notes (Signed)
Pt just returned from c-t 

## 2021-11-06 NOTE — ED Notes (Signed)
[facility] here for pt. 

## 2021-11-07 ENCOUNTER — Inpatient Hospital Stay (HOSPITAL_COMMUNITY): Payer: Medicare Other

## 2021-11-07 ENCOUNTER — Other Ambulatory Visit: Payer: Self-pay

## 2021-11-07 DIAGNOSIS — N179 Acute kidney failure, unspecified: Secondary | ICD-10-CM | POA: Diagnosis not present

## 2021-11-07 DIAGNOSIS — J9601 Acute respiratory failure with hypoxia: Secondary | ICD-10-CM | POA: Diagnosis not present

## 2021-11-07 DIAGNOSIS — Z9911 Dependence on respirator [ventilator] status: Secondary | ICD-10-CM | POA: Diagnosis not present

## 2021-11-07 DIAGNOSIS — E872 Acidosis, unspecified: Secondary | ICD-10-CM

## 2021-11-07 DIAGNOSIS — K567 Ileus, unspecified: Secondary | ICD-10-CM | POA: Diagnosis not present

## 2021-11-07 DIAGNOSIS — E870 Hyperosmolality and hypernatremia: Secondary | ICD-10-CM

## 2021-11-07 LAB — POCT I-STAT 7, (LYTES, BLD GAS, ICA,H+H)
Acid-base deficit: 8 mmol/L — ABNORMAL HIGH (ref 0.0–2.0)
Bicarbonate: 17.3 mmol/L — ABNORMAL LOW (ref 20.0–28.0)
Calcium, Ion: 1.05 mmol/L — ABNORMAL LOW (ref 1.15–1.40)
HCT: 36 % — ABNORMAL LOW (ref 39.0–52.0)
Hemoglobin: 12.2 g/dL — ABNORMAL LOW (ref 13.0–17.0)
O2 Saturation: 91 %
Potassium: 3.7 mmol/L (ref 3.5–5.1)
Sodium: 155 mmol/L — ABNORMAL HIGH (ref 135–145)
TCO2: 18 mmol/L — ABNORMAL LOW (ref 22–32)
pCO2 arterial: 32.4 mmHg (ref 32–48)
pH, Arterial: 7.335 — ABNORMAL LOW (ref 7.35–7.45)
pO2, Arterial: 65 mmHg — ABNORMAL LOW (ref 83–108)

## 2021-11-07 LAB — CBC
HCT: 44.5 % (ref 39.0–52.0)
Hemoglobin: 14.6 g/dL (ref 13.0–17.0)
MCH: 31.5 pg (ref 26.0–34.0)
MCHC: 32.8 g/dL (ref 30.0–36.0)
MCV: 95.9 fL (ref 80.0–100.0)
Platelets: 120 10*3/uL — ABNORMAL LOW (ref 150–400)
RBC: 4.64 MIL/uL (ref 4.22–5.81)
RDW: 14.6 % (ref 11.5–15.5)
WBC: 5.7 10*3/uL (ref 4.0–10.5)
nRBC: 0 % (ref 0.0–0.2)

## 2021-11-07 LAB — COMPREHENSIVE METABOLIC PANEL
ALT: 39 U/L (ref 0–44)
AST: 67 U/L — ABNORMAL HIGH (ref 15–41)
Albumin: 2 g/dL — ABNORMAL LOW (ref 3.5–5.0)
Alkaline Phosphatase: 65 U/L (ref 38–126)
Anion gap: 14 (ref 5–15)
BUN: 49 mg/dL — ABNORMAL HIGH (ref 6–20)
CO2: 17 mmol/L — ABNORMAL LOW (ref 22–32)
Calcium: 7 mg/dL — ABNORMAL LOW (ref 8.9–10.3)
Chloride: 123 mmol/L — ABNORMAL HIGH (ref 98–111)
Creatinine, Ser: 2.83 mg/dL — ABNORMAL HIGH (ref 0.61–1.24)
GFR, Estimated: 29 mL/min — ABNORMAL LOW (ref >60)
Glucose, Bld: 79 mg/dL (ref 70–99)
Potassium: 3.3 mmol/L — ABNORMAL LOW (ref 3.5–5.1)
Sodium: 154 mmol/L — ABNORMAL HIGH (ref 135–145)
Total Bilirubin: 1.3 mg/dL — ABNORMAL HIGH (ref 0.3–1.2)
Total Protein: 4.9 g/dL — ABNORMAL LOW (ref 6.5–8.1)

## 2021-11-07 LAB — BASIC METABOLIC PANEL
Anion gap: 6 (ref 5–15)
BUN: 39 mg/dL — ABNORMAL HIGH (ref 6–20)
CO2: 24 mmol/L (ref 22–32)
Calcium: 7.7 mg/dL — ABNORMAL LOW (ref 8.9–10.3)
Chloride: 123 mmol/L — ABNORMAL HIGH (ref 98–111)
Creatinine, Ser: 2.01 mg/dL — ABNORMAL HIGH (ref 0.61–1.24)
GFR, Estimated: 44 mL/min — ABNORMAL LOW (ref >60)
Glucose, Bld: 124 mg/dL — ABNORMAL HIGH (ref 70–99)
Potassium: 3.3 mmol/L — ABNORMAL LOW (ref 3.5–5.1)
Sodium: 153 mmol/L — ABNORMAL HIGH (ref 135–145)

## 2021-11-07 LAB — GLUCOSE, CAPILLARY
Glucose-Capillary: 119 mg/dL — ABNORMAL HIGH (ref 70–99)
Glucose-Capillary: 130 mg/dL — ABNORMAL HIGH (ref 70–99)
Glucose-Capillary: 132 mg/dL — ABNORMAL HIGH (ref 70–99)
Glucose-Capillary: 71 mg/dL (ref 70–99)
Glucose-Capillary: 76 mg/dL (ref 70–99)

## 2021-11-07 LAB — RESPIRATORY PANEL BY PCR

## 2021-11-07 LAB — LACTIC ACID, PLASMA
Lactic Acid, Venous: 2.2 mmol/L (ref 0.5–1.9)
Lactic Acid, Venous: 1.7 mmol/L (ref 0.5–1.9)

## 2021-11-07 LAB — MAGNESIUM: Magnesium: 2.1 mg/dL (ref 1.7–2.4)

## 2021-11-07 MED ORDER — POTASSIUM CHLORIDE 10 MEQ/100ML IV SOLN
10.0000 meq | INTRAVENOUS | Status: AC
  Administered 2021-11-07 (×3): 10 meq via INTRAVENOUS
  Filled 2021-11-07 (×3): qty 100

## 2021-11-07 MED ORDER — VITAL AF 1.2 CAL PO LIQD
1000.0000 mL | ORAL | Status: DC
  Administered 2021-11-07: 1000 mL

## 2021-11-07 MED ORDER — ACETAMINOPHEN 325 MG PO TABS
650.0000 mg | ORAL_TABLET | Freq: Four times a day (QID) | ORAL | Status: DC | PRN
  Administered 2021-11-07 – 2021-11-27 (×6): 650 mg
  Filled 2021-11-07 (×6): qty 2

## 2021-11-07 MED ORDER — FENTANYL CITRATE PF 50 MCG/ML IJ SOSY: 50.0000 ug | PREFILLED_SYRINGE | Freq: Once | INTRAMUSCULAR | Status: AC

## 2021-11-07 MED ORDER — FENTANYL 2500MCG IN NS 250ML (10MCG/ML) PREMIX INFUSION
25.0000 ug/h | INTRAVENOUS | Status: AC
  Administered 2021-11-07: 50 ug/h via INTRAVENOUS
  Filled 2021-11-07: qty 250

## 2021-11-07 MED ORDER — PROSOURCE TF20 ENFIT COMPATIBL EN LIQD: 60.0000 mL | Freq: Every day | ENTERAL | Status: DC

## 2021-11-07 MED ORDER — VITAL AF 1.2 CAL PO LIQD
1000.0000 mL | ORAL | Status: DC
  Administered 2021-11-07 – 2021-11-09 (×5): 1000 mL

## 2021-11-07 MED ORDER — SODIUM BICARBONATE 8.4 % IV SOLN
INTRAVENOUS | Status: DC
  Filled 2021-11-07 (×2): qty 1000

## 2021-11-07 MED ORDER — POTASSIUM CHLORIDE 20 MEQ PO PACK
40.0000 meq | PACK | Freq: Once | ORAL | Status: AC
  Administered 2021-11-07: 40 meq
  Filled 2021-11-07: qty 2

## 2021-11-07 MED ORDER — SODIUM CHLORIDE 0.9% FLUSH: 10.0000 mL | INTRAVENOUS | Status: DC | PRN

## 2021-11-07 MED ORDER — PANTOPRAZOLE 2 MG/ML SUSPENSION
40.0000 mg | Freq: Every day | ORAL | Status: DC
  Administered 2021-11-07 – 2021-11-09 (×3): 40 mg
  Filled 2021-11-07 (×4): qty 20

## 2021-11-07 MED ORDER — CALCIUM GLUCONATE-NACL 2-0.675 GM/100ML-% IV SOLN
2.0000 g | Freq: Once | INTRAVENOUS | Status: AC
Start: 2021-11-07 — End: 2021-11-07
  Administered 2021-11-07: 2000 mg via INTRAVENOUS
  Filled 2021-11-07: qty 100

## 2021-11-07 MED ORDER — FREE WATER
200.0000 mL | Status: DC
  Administered 2021-11-07 – 2021-11-11 (×18): 200 mL

## 2021-11-07 MED ORDER — FENTANYL BOLUS VIA INFUSION
50.0000 ug | INTRAVENOUS | Status: DC | PRN
  Administered 2021-11-07: 50 ug via INTRAVENOUS
  Administered 2021-11-08: 25 ug via INTRAVENOUS

## 2021-11-07 MED ORDER — SODIUM CHLORIDE 0.9% FLUSH
10.0000 mL | Freq: Two times a day (BID) | INTRAVENOUS | Status: DC
  Administered 2021-11-07 – 2021-11-10 (×7): 10 mL
  Administered 2021-11-11: 20 mL
  Administered 2021-11-11 – 2021-11-13 (×3): 10 mL
  Administered 2021-11-13: 20 mL
  Administered 2021-11-14: 10 mL
  Administered 2021-11-14: 20 mL
  Administered 2021-11-15: 10 mL

## 2021-11-07 MED ORDER — ACETAMINOPHEN 650 MG RE SUPP
650.0000 mg | Freq: Three times a day (TID) | RECTAL | Status: DC | PRN
  Administered 2021-11-07: 650 mg via RECTAL
  Filled 2021-11-07: qty 1

## 2021-11-07 NOTE — Progress Notes (Signed)
Gastric residuals checked shortly after 200cc FWF and trickle TF have been running. Residuals only 70cc.   Plan: Con't TF and FWF   Steffanie Dunn, DO 11/07/21 6:38 PM [facility]

## 2021-11-07 NOTE — Progress Notes (Signed)
Initial Nutrition Assessment  DOCUMENTATION CODES:   Not applicable  INTERVENTION:   Initiate tube feeding via OG tube: Vital AF 1.2 at 70 ml/h (1680 ml per day)  Provides 2016 kcal, 126 gm protein, 1362 ml free water daily.  NUTRITION DIAGNOSIS:   Inadequate oral intake related to inability to eat as evidenced by NPO status.  GOAL:   Patient will meet greater than or equal to 90% of their needs  MONITOR:   Vent status, TF tolerance, Labs  REASON FOR ASSESSMENT:   Ventilator, Consult Enteral/tube feeding initiation and management  ASSESSMENT:   31 yo male admitted with sepsis d/t aspiration PNA, AKI. 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  Per discussion with patient's father, he typically eats well at home. He is able to feed himself with supervision to prevent eating too quickly. He is able to walk around. He gets hyper if he eats a lot of dairy or wheat, so these foods are limited in his diet.   Discussed patient in ICU rounds and with RN today. Okay to begin TF via OG tube. Receiving free water flushes 200 ml every 4 hours. He had an ileus, but it has resolved.  Patient is currently intubated on ventilator support MV: 15.1 L/min Temp (24hrs), Avg:101 F (38.3 C), Min:98.2 F (36.8 C), Max:102.4 F (39.1 C)   Labs reviewed. Na 155, phos 6 CBG: 71-76  Medications reviewed and include fentanyl. IVF: D5 with sodium bicarb at 75 ml/h.   NUTRITION - FOCUSED PHYSICAL EXAM:  Flowsheet Row Most Recent Value  Orbital Region No depletion  Upper Arm Region No depletion  Thoracic and Lumbar Region No depletion  Buccal Region No depletion  Temple Region No depletion  Clavicle Bone Region No depletion  Clavicle and Acromion Bone Region No depletion  Scapular Bone Region No depletion  Dorsal Hand No depletion  Patellar Region No depletion  Anterior Thigh Region No depletion  Posterior Calf Region No depletion  Edema (RD Assessment) None  Hair  Reviewed  Eyes Unable to assess  Mouth Unable to assess  Skin Reviewed  Nails Reviewed       Diet Order:   Diet Order             Diet NPO time specified  Diet effective now                   EDUCATION NEEDS:   No education needs have been identified at this time  Skin:  Skin Assessment: Reviewed RN Assessment  Last BM:  8/14 type 7  Height:   Ht Readings from Last 1 Encounters:  10/26/2021 5\' 6"  (1.676 m)    Weight:   Wt Readings from Last 1 Encounters:  11/07/21 71.6 kg    Ideal Body Weight:  64.5 kg  BMI:  Body mass index is 25.48 kg/m.  Estimated Nutritional Needs:   Kcal:  2000-2300  Protein:  100-120 gm  Fluid:  2-2.3 L   11/09/21 RD, LDN, CNSC Please refer to Amion for contact information.

## 2021-11-07 NOTE — Progress Notes (Signed)
eLink Physician-Brief Progress Note Patient Name: Todd Snyder DOB: 07-13-1990 MRN: [id number]   Date of Service  11/07/2021  HPI/Events of Note  Notified of fever 101.3. Ongoing ice packing. Received request for Tylenol supp  eICU Interventions  Tylenol 650 mg per suppository ordered as needed for fever and pain. Not to exceed 2 grams in 24 hours     Intervention Category Minor Interventions: Routine modifications to care plan (e.g. PRN medications for pain, fever)  Rosalie Gums Azahel Belcastro 11/07/2021, 1:31 AM

## 2021-11-07 NOTE — Progress Notes (Signed)
STE alarm on monitor. 12L EKG obtained showing ST with premature supraventricular complexes, low voltage QRS, and nonspecific ST abnormality. Dr. Meier(PCCM) reviewed EKG. No additional orders received. Will continue to monitor pt closely.

## 2021-11-07 NOTE — Progress Notes (Signed)
NAME:  Todd Snyder, MRN:  [id number], DOB:  April 06, 1990, LOS: 1 ADMISSION DATE:  11/08/2021,  CHIEF COMPLAINT:  Dyspnea, weakness  History of Present Illness:  45M with a hx of autism. Was well until 2 days prior to admission when he was noted to have nausea, vomiting, poor PO intake. Experienced progressive weakness, tachypnea, tachycardia. EMS was call and he was found in respiratory distress.  In ED required intubation and MV. Eval revealed LLL PNA, acute renal failure. Also question ileus vs bowel obstruction. Transfers to [facility] with respiratory failure, septic shock and acute renal failure.   Pertinent  Medical History   Past Medical History:  Diagnosis Date   Autism    Bilateral club feet     Significant Hospital Events: Including procedures, antibiotic start and stop dates in addition to other pertinent events   CT abdomen pelvis 8/13 >>   Interim History / Subjective:  Father at bedside to provide history. No further secretions from mouth. Minimal output into cannister. Not had BM since Friday.  Patient opens eyes to voice.   Objective   Blood pressure 101/61, pulse (!) 112, temperature (!) 100.8 F (38.2 C), resp. rate (!) 31, height _0  (1.676 m), weight 71.6 kg, SpO2 100 %. CVP:  [5 mmHg-9 mmHg] 8 mmHg  Vent Mode: PRVC FiO2 (%):  [60 %-100 %] 60 % Set Rate:  [20 bmp-24 bmp] 24 bmp Vt Set:  [380 mL-510 mL] 510 mL PEEP:  [5 cmH20-8 cmH20] 8 cmH20 Plateau Pressure:  [24 cmH20-29 cmH20] 25 cmH20   Intake/Output Summary (Last 24 hours) at 11/07/2021 0754 Last data filed at 11/07/2021 0600 Gross per 24 hour  Intake 2817.04 ml  Output 1160 ml  Net 1657.04 ml   Filed Weights   10/26/2021 1520 11/20/2021 1945 11/07/21 0359  Weight: 74.8 kg 71.6 kg 71.6 kg    Examination: General:  critically ill young appearing male on mech vent HEENT: MM pink/moist; ETT in place; Left eye drainage with erythema Neuro: sedated, baseline deficits. Opens eyes to voice CV: s1s2, tachy  150s, no m/r/g PULM:  dim BS bilaterally; on mech vent PRVC, crackles LLL GI: soft, bsx4 diminished Extremities: warm/dry, no edema Skin: no rashes or lesions appreciated  Resolved Hospital Problem list     Assessment & Plan:   LLL Pneumonia:  Sepsis Tachycardia:  likely aspiration, increasing LLL density. BAL gram positive cocci in pairs and gram positive rods. Tachycardia appears Normal sinus; likely dehydration -continue IV fluids -follow bcx2 and uc, resp culture -azithromycin and rocephin for pneumonia -trend wbc/fever curve -check bmp and mag -BP stable; consider metoprolol if tachycardia persists despite iv fluids  Acute respiratory failure with hypoxia/hypercarbia due to the above Improving with mechanical ventilation -LTVV strategy with tidal volumes of 6-8 cc/kg ideal body weight -check ABG and adjust settings accordingly -Wean PEEP/FiO2 for SpO2 >92% -VAP bundle in place -Daily SAT and SBT -PAD protocol in place -wean sedation for RASS goal 0 to -1 -Follow intermittent CXR and ABG PRN -bicarb drip - ABG - BMP - Mag  Acute encephalopathy:  Hx of ataxic cerebral palsy and autism:  baseline nonverbal; communicates with hand signals. Acute component likely in setting of sepsis and hypercarbia. -limit sedating meds -vent adjusted for paco2 goal 35-45 -cont abx as above -consider CT head if no improvement  Acute renal failure AG metabolic acidosis - resolved Likely prerenal in the setting of poor PO intake and vomiting. Gap resolved. Improving with fluids - fluids -Trend  BMP / urinary output -Replace electrolytes as indicated -Avoid nephrotoxic agents, ensure adequate renal perfusion  Hypernatremia Likely dehydration related given recent N/V and decreased appetite -d5 fluids for persistently elevated Na -trend bmp closely; avoid decreasing na > 10 meq in 24 hours  Ileus vs Bowel obstruction No physical exam findings. Possible air from aggressive  bagging prior to intubation. Minimal OG output.  - KUB. If true ileus, can call surgery -NPO -OG on low intermittent suction -monitor for BM  Left bacterial vs. Viral conjunctivitis P: -Polytrim 2 drops in left eye q6 for 7 days  Mild transaminitis: likely in setting of shock Improving -trend cmp  Best Practice (right click and "Reselect all SmartList Selections" daily)   Diet/type: NPO DVT prophylaxis: prophylactic heparin  GI prophylaxis: PPI Lines: N/A Foley:  N/A Code Status:  full code Last date of multidisciplinary goals of care discussion [8/13 spoke with father over phone. State patient is non verbal at baseline but able to make some signs with hands to communicate. Lives in [place]. ]  Labs   CBC: Recent Labs  Lab 11/14/2021 1524 11/16/2021 1534 11/11/2021 2033 10/28/2021 2111 11/07/21 0519  WBC  --  11.1* 4.0  --  5.7  NEUTROABS  --  5.7  --   --   --   HGB 18.7* 18.3* 16.1 15.3 14.6  HCT 55.0* 56.6* 49.4 45.0 44.5  MCV  --  96.3 95.2  --  95.9  PLT  --  212 143*  --  120*    Basic Metabolic Panel: Recent Labs  Lab 11/01/2021 1524 11/24/2021 1534 10/29/2021 2033 10/28/2021 2111 11/07/21 0519  NA 153* 154* 153* 155* 154*  K 4.0 4.2 3.5 3.5 3.3*  CL 117* 118* 122*  --  123*  CO2  --  13* 20*  --  17*  GLUCOSE 115* 108* 95  --  79  BUN 79* 72* 59*  --  49*  CREATININE 5.90* 5.53* 3.83*  --  2.83*  CALCIUM  --  7.0* 6.2*  --  7.0*  MG  --   --  2.1  --   --   PHOS  --   --  6.0*  --   --    GFR: Estimated Creatinine Clearance: 33.5 mL/min (A) (by C-G formula based on SCr of 2.83 mg/dL (H)). Recent Labs  Lab 11/19/2021 1534 11/14/2021 1600 11/02/2021 2033 11/07/21 0519  PROCALCITON  --   --  >150.00  --   WBC 11.1*  --  4.0 5.7  LATICACIDVEN  --  3.5* 2.2* 2.2*    Liver Function Tests: Recent Labs  Lab 11/11/2021 1534 11/07/21 0519  AST 84* 67*  ALT 45* 39  ALKPHOS 85 65  BILITOT 1.3* 1.3*  PROT 6.7 4.9*  ALBUMIN 3.2* 2.0*   No results for input(s):  "LIPASE", "AMYLASE" in the last 168 hours. Recent Labs  Lab 11/19/2021 2033  AMMONIA 38*    ABG    Component Value Date/Time   PHART 7.201 (L) 11/24/2021 2111   PCO2ART 49.8 (H) 11/04/2021 2111   PO2ART 91 11/11/2021 2111   HCO3 19.3 (L) 10/31/2021 2111   TCO2 21 (L) 10/28/2021 2111   ACIDBASEDEF 9.0 (H) 11/10/2021 2111   O2SAT 94 11/05/2021 2111     Coagulation Profile: Recent Labs  Lab 11/10/2021 1613  INR 1.9*    Cardiac Enzymes: No results for input(s): "CKTOTAL", "CKMB", "CKMBINDEX", "TROPONINI" in the last 168 hours.  HbA1C: No results found for: "HGBA1C"  CBG: Recent Labs  Lab 11/04/2021 1943 11/04/2021 2346 11/07/21 0335  GLUCAP 95 77 71    Review of Systems:   Patient is encephalopathic and/or intubated. Therefore history has been obtained from chart review.    Past Medical History:  He,  has a past medical history of Autism and Bilateral club feet.   Surgical History:    Social History:      Family History:  His family history is not on file.   Allergies Allergies  Allergen Reactions   Latex      Home Medications  Prior to Admission medications   Not on File     Critical care time: 45 minutes    JD Rexene Agent [facility] 11/07/2021, 7:54 AM  Please see [email or web address] for pager details.  From 7A-7P if no response, please call [phone]. After hours, please call ELink [phone].

## 2021-11-08 DIAGNOSIS — N179 Acute kidney failure, unspecified: Secondary | ICD-10-CM | POA: Diagnosis not present

## 2021-11-08 DIAGNOSIS — J9601 Acute respiratory failure with hypoxia: Secondary | ICD-10-CM | POA: Diagnosis not present

## 2021-11-08 DIAGNOSIS — K529 Noninfective gastroenteritis and colitis, unspecified: Secondary | ICD-10-CM

## 2021-11-08 DIAGNOSIS — J155 Pneumonia due to Escherichia coli: Secondary | ICD-10-CM

## 2021-11-08 DIAGNOSIS — Z9911 Dependence on respirator [ventilator] status: Secondary | ICD-10-CM | POA: Diagnosis not present

## 2021-11-08 LAB — CULTURE, BAL-QUANTITATIVE W GRAM STAIN: Culture: 50000 — AB

## 2021-11-08 LAB — LEGIONELLA PNEUMOPHILA SEROGP 1 UR AG: L. pneumophila Serogp 1 Ur Ag: NEGATIVE

## 2021-11-08 LAB — URINE CULTURE: Culture: NO GROWTH

## 2021-11-08 LAB — BASIC METABOLIC PANEL
Anion gap: 6 (ref 5–15)
BUN: 31 mg/dL — ABNORMAL HIGH (ref 6–20)
CO2: 27 mmol/L (ref 22–32)
Calcium: 7.4 mg/dL — ABNORMAL LOW (ref 8.9–10.3)
Chloride: 118 mmol/L — ABNORMAL HIGH (ref 98–111)
Creatinine, Ser: 1.37 mg/dL — ABNORMAL HIGH (ref 0.61–1.24)
GFR, Estimated: >60 mL/min (ref >60)
Glucose, Bld: 156 mg/dL — ABNORMAL HIGH (ref 70–99)
Potassium: 3.5 mmol/L (ref 3.5–5.1)
Sodium: 151 mmol/L — ABNORMAL HIGH (ref 135–145)

## 2021-11-08 LAB — GLUCOSE, CAPILLARY
Glucose-Capillary: 120 mg/dL — ABNORMAL HIGH (ref 70–99)
Glucose-Capillary: 126 mg/dL — ABNORMAL HIGH (ref 70–99)
Glucose-Capillary: 109 mg/dL — ABNORMAL HIGH (ref 70–99)
Glucose-Capillary: 104 mg/dL — ABNORMAL HIGH (ref 70–99)
Glucose-Capillary: 119 mg/dL — ABNORMAL HIGH (ref 70–99)

## 2021-11-08 MED ORDER — CEFAZOLIN SODIUM-DEXTROSE 2-4 GM/100ML-% IV SOLN
2.0000 g | Freq: Three times a day (TID) | INTRAVENOUS | Status: DC
  Administered 2021-11-08 – 2021-11-09 (×2): 2 g via INTRAVENOUS
  Filled 2021-11-08 (×3): qty 100

## 2021-11-08 MED ORDER — POTASSIUM CHLORIDE 20 MEQ PO PACK
40.0000 meq | PACK | Freq: Once | ORAL | Status: AC
  Administered 2021-11-08: 40 meq
  Filled 2021-11-08: qty 2

## 2021-11-08 MED ORDER — LACTATED RINGERS IV BOLUS
1000.0000 mL | Freq: Once | INTRAVENOUS | Status: AC
  Administered 2021-11-08: 1000 mL via INTRAVENOUS

## 2021-11-08 MED ORDER — ALBUMIN HUMAN 5 % IV SOLN
25.0000 g | Freq: Once | INTRAVENOUS | Status: AC
  Administered 2021-11-08: 25 g via INTRAVENOUS
  Filled 2021-11-08: qty 500

## 2021-11-08 MED ORDER — DEXMEDETOMIDINE HCL IN NACL 400 MCG/100ML IV SOLN
0.0000 ug/kg/h | INTRAVENOUS | Status: AC
  Administered 2021-11-08 – 2021-11-09 (×2): 0.4 ug/kg/h via INTRAVENOUS
  Filled 2021-11-08 (×2): qty 100

## 2021-11-08 MED ORDER — FUROSEMIDE 10 MG/ML IJ SOLN
20.0000 mg | Freq: Once | INTRAMUSCULAR | Status: AC
Start: 2021-11-08 — End: 2021-11-08
  Administered 2021-11-08: 20 mg via INTRAVENOUS
  Filled 2021-11-08: qty 2

## 2021-11-08 NOTE — Plan of Care (Signed)
  Problem: Clinical Measurements: Goal: Diagnostic test results will improve Outcome: Progressing   

## 2021-11-08 NOTE — Progress Notes (Signed)
Entered pt's room, noticed emesis on pt's grown. TF at 19ml/hr. Residue measured .

## 2021-11-08 NOTE — Progress Notes (Addendum)
NAME:  Todd Snyder, MRN:  [id number], DOB:  23-Sep-1990, LOS: 2 ADMISSION DATE:  10/26/2021,  CHIEF COMPLAINT:  Dyspnea, weakness  History of Present Illness:  49M with a hx of autism. Was well until 2 days prior to admission when he was noted to have nausea, vomiting, poor PO intake. Experienced progressive weakness, tachypnea, tachycardia. EMS was call and he was found in respiratory distress.  In ED required intubation and MV. Eval revealed LLL PNA, acute renal failure. Also question ileus vs bowel obstruction. Transfers to [facility] with respiratory failure, septic shock and acute renal failure.   Pertinent  Medical History   Past Medical History:  Diagnosis Date   Autism    Bilateral club feet     Significant Hospital Events: Including procedures, antibiotic start and stop dates in addition to other pertinent events   CT abdomen pelvis 8/13 >>   Interim History / Subjective:  Father at bedside to provide history. No events overnight. Had BM yesterday. Patient opens eyes to voice.   Objective   Blood pressure 108/71, pulse 100, temperature (!) 100.6 F (38.1 C), resp. rate (!) 23, height _0  (1.676 m), weight 76.5 kg, SpO2 95 %. CVP:  [3 mmHg-11 mmHg] 5 mmHg  Vent Mode: PRVC FiO2 (%):  [40 %-60 %] 40 % Set Rate:  [22 bmp-24 bmp] 22 bmp Vt Set:  [510 mL] 510 mL PEEP:  [8 cmH20] 8 cmH20 Plateau Pressure:  [24 cmH20-32 cmH20] 24 cmH20   Intake/Output Summary (Last 24 hours) at 11/08/2021 0648 Last data filed at 11/08/2021 0600 Gross per 24 hour  Intake 4599.81 ml  Output 1045 ml  Net 3554.81 ml    Filed Weights   11/23/2021 1945 11/07/21 0359 11/08/21 0522  Weight: 71.6 kg 71.6 kg 76.5 kg    Examination: General:  chronically ill young appearing male on mech vent HEENT: MM pink/moist; ETT in place; Left eye drainage with erythema Neuro: sedated, baseline deficits. Opens eyes to voice CV: s1s2, RRR no m/r/g PULM:  dim BS bilaterally; on mech vent PRVC, crackles LLL  improving GI: soft, bsx4 diminished Extremities: warm/dry, no edema Skin: no rashes or lesions appreciated  Resolved Hospital Problem list     Assessment & Plan:   LLL Pneumonia:  Sepsis Tachycardia:  likely aspiration, increasing LLL density. BAL gram positive cocci in pairs and gram positive rods. Tachycardia appears Normal sinus; likely dehydration Improving -continue IV fluids -follow bcx2 and uc, resp culture - CTX changed to cefazolin. AZT stopped -trend wbc/fever curve -check bmp and mag -BP stable; consider metoprolol if tachycardia persists despite iv fluids  Acute respiratory failure with hypoxia/hypercarbia due to the above Improving with mechanical ventilation -LTVV strategy with tidal volumes of 6-8 cc/kg ideal body weight -check ABG and adjust settings accordingly -Wean PEEP/FiO2 for SpO2 >92% -VAP bundle in place -Daily SAT and SBT -PAD protocol in place -wean sedation for RASS goal 0 to -1 - stop fentanyl -start precedex - pt will need PT s/p extubation  Acute encephalopathy:  Hx of ataxic cerebral palsy and autism:  baseline nonverbal; communicates with hand signals. Acute component likely in setting of sepsis and hypercarbia. -limit sedating meds -vent adjusted for paco2 goal 35-45 -cont abx as above  Acute renal failure AG metabolic acidosis - resolved Likely prerenal in the setting of poor PO intake and vomiting. Gap resolved. Improving with fluids - fluids -Trend BMP / urinary output -Replace electrolytes as indicated -Avoid nephrotoxic agents, ensure adequate renal perfusion -  20 lasix once for for euvolemia  Hypernatremia Likely dehydration related given recent N/V and decreased appetite -d5 fluids for persistently elevated Na -trend bmp closely; avoid decreasing na > 10 meq in 24 hours  Hypokalemia - replete  Ileus vs Bowel obstruction No physical exam findings. Possible air from aggressive bagging prior to intubation. Minimal OG  output. Gastric residuals minimal.  BM yesterday, ileus unlikely - tube feeds  Thrombocytopenia  Likely due to infection. 4T score low probability.  - continue to monitor.  Left bacterial vs. Viral conjunctivitis P: -Polytrim 2 drops in left eye q6 for 7 days  Mild transaminitis: likely in setting of shock Improving -trend cmp  Best Practice (right click and "Reselect all SmartList Selections" daily)   Diet/type: NPO DVT prophylaxis: prophylactic heparin  GI prophylaxis: PPI Lines: N/A Foley:  N/A Code Status:  full code Last date of multidisciplinary goals of care discussion [8/13 spoke with father over phone. State patient is non verbal at baseline but able to make some signs with hands to communicate. Lives in [place]. ]  Delene Ruffini, MD

## 2021-11-08 NOTE — Progress Notes (Signed)
eLink Physician-Brief Progress Note Patient Name: Todd Snyder DOB: 13-Nov-1990 MRN: [id number]   Date of Service  11/08/2021  HPI/Events of Note  BP 89/53, MAP 64 since low dose Precedex gtt was started, serum albumin 2.0, patient is on enteral nutrition at 60 ml / hour.  eICU Interventions  Albumin 5 % 500 ml iv bolus x 1.        Migdalia Dk 11/08/2021, 8:47 PM

## 2021-11-08 NOTE — Progress Notes (Signed)
eLink Physician-Brief Progress Note Patient Name: Todd Snyder DOB: 1991/03/21 MRN: [id number]   Date of Service  11/08/2021  HPI/Events of Note  BP remains soft  (88/50, MAP 62) after 500 ml iv albumin bolus, his labs are consistent with significant volume depletion.  eICU Interventions  LR 1000 ml iv bolus ordered.        Thomasene Lot Yaneth Fairbairn 11/08/2021, 10:50 PM

## 2021-11-09 ENCOUNTER — Inpatient Hospital Stay (HOSPITAL_COMMUNITY): Payer: Medicare Other

## 2021-11-09 DIAGNOSIS — Z9911 Dependence on respirator [ventilator] status: Secondary | ICD-10-CM | POA: Diagnosis not present

## 2021-11-09 DIAGNOSIS — K529 Noninfective gastroenteritis and colitis, unspecified: Secondary | ICD-10-CM | POA: Diagnosis not present

## 2021-11-09 DIAGNOSIS — N179 Acute kidney failure, unspecified: Secondary | ICD-10-CM | POA: Diagnosis not present

## 2021-11-09 DIAGNOSIS — J9601 Acute respiratory failure with hypoxia: Secondary | ICD-10-CM | POA: Diagnosis not present

## 2021-11-09 LAB — BLOOD GAS, VENOUS
Acid-Base Excess: 3.1 mmol/L — ABNORMAL HIGH (ref 0.0–2.0)
Bicarbonate: 27 mmol/L (ref 20.0–28.0)
O2 Saturation: 81.9 %
Patient temperature: 37
pCO2, Ven: 38 mmHg — ABNORMAL LOW (ref 44–60)
pH, Ven: 7.46 — ABNORMAL HIGH (ref 7.25–7.43)
pO2, Ven: 48 mmHg — ABNORMAL HIGH (ref 32–45)

## 2021-11-09 LAB — CBC WITH DIFFERENTIAL/PLATELET
Abs Immature Granulocytes: 0.05 10*3/uL (ref 0.00–0.07)
Basophils Absolute: 0 10*3/uL (ref 0.0–0.1)
Basophils Relative: 1 %
Eosinophils Absolute: 0 10*3/uL (ref 0.0–0.5)
Eosinophils Relative: 0 %
HCT: 29.1 % — ABNORMAL LOW (ref 39.0–52.0)
Hemoglobin: 9.8 g/dL — ABNORMAL LOW (ref 13.0–17.0)
Immature Granulocytes: 1 %
Lymphocytes Relative: 9 %
Lymphs Abs: 0.5 10*3/uL — ABNORMAL LOW (ref 0.7–4.0)
MCH: 31.1 pg (ref 26.0–34.0)
MCHC: 33.7 g/dL (ref 30.0–36.0)
MCV: 92.4 fL (ref 80.0–100.0)
Monocytes Absolute: 0.2 10*3/uL (ref 0.1–1.0)
Monocytes Relative: 4 %
Neutro Abs: 4.4 10*3/uL (ref 1.7–7.7)
Neutrophils Relative %: 85 %
Platelets: 73 10*3/uL — ABNORMAL LOW (ref 150–400)
RBC: 3.15 MIL/uL — ABNORMAL LOW (ref 4.22–5.81)
RDW: 14.9 % (ref 11.5–15.5)
WBC: 5.2 10*3/uL (ref 4.0–10.5)
nRBC: 0.4 % — ABNORMAL HIGH (ref 0.0–0.2)

## 2021-11-09 LAB — C DIFFICILE QUICK SCREEN W PCR REFLEX
C Diff antigen: NEGATIVE
C Diff interpretation: NOT DETECTED
C Diff toxin: NEGATIVE

## 2021-11-09 LAB — CBC
HCT: 33.1 % — ABNORMAL LOW (ref 39.0–52.0)
Hemoglobin: 10.7 g/dL — ABNORMAL LOW (ref 13.0–17.0)
MCH: 30.5 pg (ref 26.0–34.0)
MCHC: 32.3 g/dL (ref 30.0–36.0)
MCV: 94.3 fL (ref 80.0–100.0)
Platelets: 92 10*3/uL — ABNORMAL LOW (ref 150–400)
RBC: 3.51 MIL/uL — ABNORMAL LOW (ref 4.22–5.81)
RDW: 15.1 % (ref 11.5–15.5)
WBC: 7.2 10*3/uL (ref 4.0–10.5)
nRBC: 0.3 % — ABNORMAL HIGH (ref 0.0–0.2)

## 2021-11-09 LAB — BASIC METABOLIC PANEL
Anion gap: 8 (ref 5–15)
BUN: 35 mg/dL — ABNORMAL HIGH (ref 6–20)
CO2: 25 mmol/L (ref 22–32)
Calcium: 7.2 mg/dL — ABNORMAL LOW (ref 8.9–10.3)
Chloride: 115 mmol/L — ABNORMAL HIGH (ref 98–111)
Creatinine, Ser: 1.13 mg/dL (ref 0.61–1.24)
GFR, Estimated: >60 mL/min (ref >60)
Glucose, Bld: 123 mg/dL — ABNORMAL HIGH (ref 70–99)
Potassium: 3.2 mmol/L — ABNORMAL LOW (ref 3.5–5.1)
Sodium: 148 mmol/L — ABNORMAL HIGH (ref 135–145)

## 2021-11-09 LAB — DIC (DISSEMINATED INTRAVASCULAR COAGULATION)PANEL
D-Dimer, Quant: 2.75 ug/mL-FEU — ABNORMAL HIGH (ref 0.00–0.50)
Fibrinogen: >800 mg/dL — ABNORMAL HIGH (ref 210–475)
INR: 1.3 — ABNORMAL HIGH (ref 0.8–1.2)
Platelets: 90 10*3/uL — ABNORMAL LOW (ref 150–400)
Prothrombin Time: 16.1 seconds — ABNORMAL HIGH (ref 11.4–15.2)
Smear Review: NONE SEEN
aPTT: 59 seconds — ABNORMAL HIGH (ref 24–36)

## 2021-11-09 LAB — GLUCOSE, CAPILLARY
Glucose-Capillary: 113 mg/dL — ABNORMAL HIGH (ref 70–99)
Glucose-Capillary: 123 mg/dL — ABNORMAL HIGH (ref 70–99)
Glucose-Capillary: 133 mg/dL — ABNORMAL HIGH (ref 70–99)
Glucose-Capillary: 151 mg/dL — ABNORMAL HIGH (ref 70–99)
Glucose-Capillary: 82 mg/dL (ref 70–99)
Glucose-Capillary: 98 mg/dL (ref 70–99)

## 2021-11-09 LAB — MAGNESIUM: Magnesium: 1.9 mg/dL (ref 1.7–2.4)

## 2021-11-09 LAB — PHOSPHORUS: Phosphorus: 1 mg/dL — CL (ref 2.5–4.6)

## 2021-11-09 LAB — LACTIC ACID, PLASMA: Lactic Acid, Venous: 1.4 mmol/L (ref 0.5–1.9)

## 2021-11-09 LAB — CORTISOL: Cortisol, Plasma: 20.6 ug/dL

## 2021-11-09 MED ORDER — POTASSIUM CHLORIDE 10 MEQ/50ML IV SOLN
10.0000 meq | INTRAVENOUS | Status: AC
  Administered 2021-11-09 (×4): 10 meq via INTRAVENOUS
  Filled 2021-11-09 (×4): qty 50

## 2021-11-09 MED ORDER — POTASSIUM & SODIUM PHOSPHATES 280-160-250 MG PO PACK
1.0000 | PACK | Freq: Three times a day (TID) | ORAL | Status: DC
  Filled 2021-11-09: qty 1

## 2021-11-09 MED ORDER — POTASSIUM CHLORIDE 20 MEQ PO PACK
20.0000 meq | PACK | ORAL | Status: AC
  Administered 2021-11-09 (×2): 20 meq
  Filled 2021-11-09 (×2): qty 1

## 2021-11-09 MED ORDER — PIPERACILLIN-TAZOBACTAM 3.375 G IVPB
3.3750 g | Freq: Three times a day (TID) | INTRAVENOUS | Status: DC
Start: 2021-11-09 — End: 2021-11-17
  Administered 2021-11-09 – 2021-11-17 (×24): 3.375 g via INTRAVENOUS
  Filled 2021-11-09 (×25): qty 50

## 2021-11-09 MED ORDER — BANATROL TF EN LIQD
60.0000 mL | Freq: Two times a day (BID) | ENTERAL | Status: DC
Start: 2021-11-09 — End: 2021-11-22
  Administered 2021-11-10 – 2021-11-22 (×22): 60 mL
  Filled 2021-11-09 (×27): qty 60

## 2021-11-09 MED ORDER — NOREPINEPHRINE 4 MG/250ML-% IV SOLN
0.0000 ug/min | INTRAVENOUS | Status: DC
  Administered 2021-11-09: 2 ug/min via INTRAVENOUS
  Filled 2021-11-09: qty 250

## 2021-11-09 MED ORDER — DEXTROSE 5 % IV SOLN: INTRAVENOUS | Status: AC

## 2021-11-09 MED ORDER — SODIUM CHLORIDE 0.9 % IV SOLN
250.0000 mL | INTRAVENOUS | Status: DC | PRN
Start: 2021-11-09 — End: 2021-11-19
  Administered 2021-11-10: 250 mL via INTRAVENOUS

## 2021-11-09 MED ORDER — MAGNESIUM SULFATE IN D5W 1-5 GM/100ML-% IV SOLN
1.0000 g | Freq: Once | INTRAVENOUS | Status: AC
Start: 2021-11-09 — End: 2021-11-09
  Administered 2021-11-09: 1 g via INTRAVENOUS
  Filled 2021-11-09: qty 100

## 2021-11-09 MED ORDER — POTASSIUM PHOSPHATES 15 MMOLE/5ML IV SOLN
30.0000 mmol | Freq: Once | INTRAVENOUS | Status: AC
  Administered 2021-11-09: 30 mmol via INTRAVENOUS
  Filled 2021-11-09: qty 10

## 2021-11-09 MED ORDER — SODIUM CHLORIDE 0.9% FLUSH: 3.0000 mL | INTRAVENOUS | Status: DC | PRN

## 2021-11-09 MED ORDER — K PHOS MONO-SOD PHOS DI & MONO 155-852-130 MG PO TABS
500.0000 mg | ORAL_TABLET | Freq: Two times a day (BID) | ORAL | Status: DC
  Filled 2021-11-09: qty 2

## 2021-11-09 MED ORDER — SODIUM CHLORIDE 0.9% FLUSH
3.0000 mL | Freq: Two times a day (BID) | INTRAVENOUS | Status: DC
  Administered 2021-11-09 – 2021-11-18 (×18): 3 mL via INTRAVENOUS

## 2021-11-09 MED ORDER — K PHOS MONO-SOD PHOS DI & MONO 155-852-130 MG PO TABS
500.0000 mg | ORAL_TABLET | Freq: Two times a day (BID) | ORAL | Status: DC
  Filled 2021-11-09 (×2): qty 2

## 2021-11-09 MED ORDER — SODIUM CHLORIDE 0.9 % IV SOLN
250.0000 mL | INTRAVENOUS | Status: DC
  Administered 2021-11-19 – 2021-11-21 (×2): 250 mL via INTRAVENOUS

## 2021-11-09 NOTE — Progress Notes (Signed)
NAME:  Todd Snyder, MRN:  [id number], DOB:  12/16/1990, LOS: 3 ADMISSION DATE:  10/26/2021,  CHIEF COMPLAINT:  Dyspnea, weakness  History of Present Illness:  31M with a hx of autism. Was well until 2 days prior to admission when he was noted to have nausea, vomiting, poor PO intake. Experienced progressive weakness, tachypnea, tachycardia. EMS was call and he was found in respiratory distress.  In ED required intubation and MV. Eval revealed LLL PNA, acute renal failure. Also question ileus vs bowel obstruction. Transfers to [facility] with respiratory failure, septic shock and acute renal failure.   Pertinent  Medical History   Past Medical History:  Diagnosis Date   Autism    Bilateral club feet     Significant Hospital Events: Including procedures, antibiotic start and stop dates in addition to other pertinent events   CT abdomen pelvis 8/13 >>   Interim History / Subjective:  Father at bedside to provide history. No events overnight. Had BM yesterday. Patient opens eyes to voice.   Objective   Blood pressure 99/69, pulse 72, temperature 98.3 F (36.8 C), temperature source Axillary, resp. rate (!) 24, height _0  (1.676 m), weight 79.2 kg, SpO2 97 %. CVP:  [9 mmHg-24 mmHg] 11 mmHg  Vent Mode: PSV;CPAP FiO2 (%):  [30 %-40 %] 30 % Set Rate:  [22 bmp] 22 bmp Vt Set:  [510 mL] 510 mL PEEP:  [6 cmH20-8 cmH20] 6 cmH20 Pressure Support:  [20 cmH20] 20 cmH20 Plateau Pressure:  [23 cmH20-32 cmH20] 24 cmH20   Intake/Output Summary (Last 24 hours) at 11/09/2021 0300 Last data filed at 11/09/2021 0600 Gross per 24 hour  Intake 3951.95 ml  Output 2410 ml  Net 1541.95 ml    Filed Weights   11/07/21 0359 11/08/21 0522 11/09/21 0500  Weight: 71.6 kg 76.5 kg 79.2 kg    Examination: General:  chronically ill young appearing male on mech vent HEENT: MM pink/moist; ETT in place; Left eye drainage with erythema Neuro: sedated, baseline deficits. Opens eyes to voice CV: s1s2, RRR no  m/r/g PULM:  dim BS bilaterally; on mech vent PRVC, crackles LLL improving GI: soft, bsx4 diminished Extremities: warm/dry, no edema Skin: no rashes or lesions appreciated  Resolved Hospital Problem list   transaminitis  Assessment & Plan:   LLL Pneumonia:  Sepsis Tachycardia and Hypotension E coli on BAL PNA Improving. Low blood pressure overnight likely due to large volume diarrhea (large watery BM C diff negative) and initiation of precedex. Did not respond to 1L LR. -started on Levo. -continue IV fluids -follow bcx2 and uc, resp culture - Zosyn -trend wbc/fever curve -check bmp and mag -BP stable; consider metoprolol if tachycardia persists despite iv fluids  Acute respiratory failure with hypoxia/hypercarbia due to the above Improving with mechanical ventilation.  On precedex, fentanyl stopped yesterday. Becomes very tachypneic when taken off support, likely very weak muscles. -LTVV strategy with tidal volumes of 6-8 cc/kg ideal body weight -check ABG and adjust settings accordingly -Wean PEEP/FiO2 for SpO2 >92% -VAP bundle in place -Daily SAT and SBT -PAD protocol in place -wean sedation for RASS goal 0 to -1 - pt will need PT s/p extubation  Acute encephalopathy:  Hx of ataxic cerebral palsy and autism:  baseline nonverbal; communicates with hand signals. Acute component likely in setting of sepsis and hypercarbia. -limit sedating meds -vent adjusted for paco2 goal 35-45 -cont abx as above  Acute renal failure AG metabolic acidosis - resolved Likely prerenal in the setting  of poor PO intake and vomiting. Gap resolved. Improving with fluids - fluids -Trend BMP / urinary output -Replace electrolytes as indicated -Avoid nephrotoxic agents, ensure adequate renal perfusion  Hypernatremia Likely dehydration related given recent N/V and decreased appetite -d5 fluids for persistently elevated Na -trend bmp closely; avoid decreasing na > 10 meq in 24  hours  Hypokalemia Hypophosphatemia - replete - oral and IV phosphate repletion  Diarrhea Prior concern for ileus but no physical exam findings. Possible air from aggressive bagging prior to intubation. Minimal OG output. Gastric residuals minimal.  Large volume watery diarrhea overnight. Possibly related to discontinuation of fentanyl. He has been receiving laxatives as well. - tube feeds - GI panel - bowel regimen stopped  Thrombocytopenia Anemia  DIC panel negative. Likely due to infection with Concern for drug induced thrombocytopenia 4T score low probability - check for HIT - switch cefazolin to Zosyn - DVT prophylaxis hold  Left bacterial vs. Viral conjunctivitis P: -Polytrim 2 drops in left eye q6 for 7 days   Best Practice (right click and "Reselect all SmartList Selections" daily)   Diet/type: NPO DVT prophylaxis: prophylactic heparin  GI prophylaxis: PPI Lines: N/A Foley:  N/A Code Status:  full code Last date of multidisciplinary goals of care discussion [8/13 spoke with father over phone. State patient is non verbal at baseline but able to make some signs with hands to communicate. Lives in [place]. ]  Delene Ruffini, MD

## 2021-11-09 NOTE — Progress Notes (Signed)
G[place]aluck MD made aware of output from OG tube.

## 2021-11-09 NOTE — Progress Notes (Signed)
eLink Physician-Brief Progress Note Patient Name: Todd Snyder DOB: June 23, 1990 MRN: [id number]   Date of Service  11/09/2021  HPI/Events of Note  Patient has put out > 1 liter of watery stool.  eICU Interventions  Flexiseal ordered.        [name] 11/09/2021, 5:28 AM

## 2021-11-09 NOTE — Progress Notes (Signed)
eLink Physician-Brief Progress Note Patient Name: Todd Snyder DOB: 25-May-1990 MRN: [id number]   Date of Service  11/09/2021  HPI/Events of Note  Heparin stopped d/t suspicion of HIT. HIT panel sent. Nursing request to order SCD's.   eICU Interventions  Will order SCD's.     Intervention Category Major Interventions: Other:  Lenell Antu 11/09/2021, 10:00 PM

## 2021-11-09 NOTE — Progress Notes (Addendum)
eLink Physician-Brief Progress Note Patient Name: Todd Snyder DOB: Oct 09, 1990 MRN: [id number]   Date of Service  11/09/2021  HPI/Events of Note  Blood pressure remains soft after fluid boluses.  eICU Interventions  Peripheral Levophed gtt ordered, VBG, serum cortisol, BMP, lactic acid, CBC ordered.        [name] 11/09/2021, 1:52 AM

## 2021-11-09 NOTE — Progress Notes (Signed)
eLink Physician-Brief Progress Note Patient Name: Todd Snyder DOB: [birth year] MRN: [id number]   Date of Service  11/09/2021  HPI/Events of Note  Resulted labs reviewed.  eICU Interventions  No intervention.        Thomasene Lot [name] 11/09/2021, 3:16 AM

## 2021-11-09 NOTE — Progress Notes (Signed)
Pt had episode of vomiting yellow emesis and large type 7 bowel movement. CCM notified. TF on hold. OGT to low intermittent suction at this time.

## 2021-11-09 NOTE — Procedures (Signed)
Cortrak  Person Inserting Tube:  Todd Snyder, Todd Snyder, RD Tube Type:  Cortrak - 43 inches Tube Size:  10 Tube Location:  Right nare Secured by: Bridle Technique Used to Measure Tube Placement:  Marking at nare/corner of mouth Cortrak Secured At:  87 cm   Cortrak Tube Team Note:  Consult received to place a Cortrak feeding tube.   X-ray is required, abdominal x-ray has been ordered by the Cortrak team. Please confirm tube placement before using the Cortrak tube.   If the tube becomes dislodged please keep the tube and contact the Cortrak team at [email or web address] (password [email or web address]) for replacement.  If after hours and replacement cannot be delayed, place a NG tube and confirm placement with an abdominal x-ray.    Kirby Crigler RD, LDN Clinical Dietitian See Loretha Stapler for contact information.

## 2021-11-09 NOTE — Progress Notes (Signed)
eLink Physician-Brief Progress Note Patient Name: Todd Snyder DOB: [birth year] MRN: [id number]   Date of Service  11/09/2021  HPI/Events of Note  Patient with large amount of loose stool but no fever or white count.  eICU Interventions  C-Diff screen not indicated.        Thomasene Lot Autie Vasudevan 11/09/2021, 5:56 AM

## 2021-11-09 NOTE — Progress Notes (Signed)
[facility] ADULT ICU REPLACEMENT PROTOCOL   The patient does apply for the [facility] Adult ICU Electrolyte Replacment Protocol based on the criteria listed below:   1.Exclusion criteria: TCTS patients, ECMO patients, and Dialysis patients 2. Is GFR >/= 30 ml/min? Yes.    Patient's GFR today is >60 3. Is SCr </= 2? Yes.   Patient's SCr is 1.13 mg/dL 4. Did SCr increase >/= 0.5 in 24 hours? No. 5.Pt's weight >40kg  Yes.   6. Abnormal electrolyte(s):   K 3.2  7. Electrolytes replaced per protocol 8.  Call MD STAT for K+ </= 2.5, Phos </= 1, or Mag </= 1 Physician:  Shawn Stall R Brittney Caraway 11/09/2021 2:55 AM

## 2021-11-09 NOTE — TOC Progression Note (Signed)
Transition of Care [facility]) - Progression Note    Patient Details  Name: BOW BUNTYN MRN: [id number] Date of Birth: [birth year]  Transition of Care [facility]) CM/SW Contact  Beckie Busing, RN Phone [phone]  11/09/2021, 4:39 PM  Clinical Narrative:     Transition of Care [facility]) Screening Note   Patient Details  Name: ROLAND LIPKE Date of Birth: [birth year]   Transition of Care [facility]) CM/SW Contact:    Beckie Busing, RN Phone Number: 11/09/2021, 4:39 PM    Transition of Care Department [facility]) has reviewed patient and no TOC needs have been identified at this time. We will continue to monitor patient advancement through interdisciplinary progression rounds. If new patient transition needs arise, please place a TOC consult.          Expected Discharge Plan and Services                                                 Social Determinants of Health (SDOH) Interventions    Readmission Risk Interventions     No data to display

## 2021-11-10 ENCOUNTER — Inpatient Hospital Stay (HOSPITAL_COMMUNITY): Payer: Medicare Other

## 2021-11-10 DIAGNOSIS — J690 Pneumonitis due to inhalation of food and vomit: Secondary | ICD-10-CM | POA: Diagnosis not present

## 2021-11-10 DIAGNOSIS — D696 Thrombocytopenia, unspecified: Secondary | ICD-10-CM | POA: Diagnosis not present

## 2021-11-10 DIAGNOSIS — J9601 Acute respiratory failure with hypoxia: Secondary | ICD-10-CM | POA: Diagnosis not present

## 2021-11-10 DIAGNOSIS — J155 Pneumonia due to Escherichia coli: Secondary | ICD-10-CM | POA: Diagnosis not present

## 2021-11-10 LAB — GLUCOSE, CAPILLARY
Glucose-Capillary: 100 mg/dL — ABNORMAL HIGH (ref 70–99)
Glucose-Capillary: 117 mg/dL — ABNORMAL HIGH (ref 70–99)
Glucose-Capillary: 75 mg/dL (ref 70–99)
Glucose-Capillary: 83 mg/dL (ref 70–99)
Glucose-Capillary: 85 mg/dL (ref 70–99)
Glucose-Capillary: 93 mg/dL (ref 70–99)
Glucose-Capillary: 95 mg/dL (ref 70–99)

## 2021-11-10 LAB — BASIC METABOLIC PANEL
Anion gap: 7 (ref 5–15)
BUN: 20 mg/dL (ref 6–20)
CO2: 25 mmol/L (ref 22–32)
Calcium: 7.4 mg/dL — ABNORMAL LOW (ref 8.9–10.3)
Chloride: 116 mmol/L — ABNORMAL HIGH (ref 98–111)
Creatinine, Ser: 0.9 mg/dL (ref 0.61–1.24)
GFR, Estimated: >60 mL/min (ref >60)
Glucose, Bld: 89 mg/dL (ref 70–99)
Potassium: 3.5 mmol/L (ref 3.5–5.1)
Sodium: 148 mmol/L — ABNORMAL HIGH (ref 135–145)

## 2021-11-10 LAB — HEPATIC FUNCTION PANEL
ALT: 32 U/L (ref 0–44)
AST: 50 U/L — ABNORMAL HIGH (ref 15–41)
Albumin: 1.7 g/dL — ABNORMAL LOW (ref 3.5–5.0)
Alkaline Phosphatase: 131 U/L — ABNORMAL HIGH (ref 38–126)
Bilirubin, Direct: 0.6 mg/dL — ABNORMAL HIGH (ref 0.0–0.2)
Indirect Bilirubin: 0.9 mg/dL (ref 0.3–0.9)
Total Bilirubin: 1.5 mg/dL — ABNORMAL HIGH (ref 0.3–1.2)
Total Protein: 4.5 g/dL — ABNORMAL LOW (ref 6.5–8.1)

## 2021-11-10 LAB — CBC
HCT: 30.6 % — ABNORMAL LOW (ref 39.0–52.0)
Hemoglobin: 10.1 g/dL — ABNORMAL LOW (ref 13.0–17.0)
MCH: 30.9 pg (ref 26.0–34.0)
MCHC: 33 g/dL (ref 30.0–36.0)
MCV: 93.6 fL (ref 80.0–100.0)
Platelets: 81 10*3/uL — ABNORMAL LOW (ref 150–400)
RBC: 3.27 MIL/uL — ABNORMAL LOW (ref 4.22–5.81)
RDW: 15.2 % (ref 11.5–15.5)
WBC: 8 10*3/uL (ref 4.0–10.5)
nRBC: 0 % (ref 0.0–0.2)

## 2021-11-10 LAB — GASTROINTESTINAL PANEL BY PCR, STOOL (REPLACES STOOL CULTURE)

## 2021-11-10 LAB — MAGNESIUM: Magnesium: 2 mg/dL (ref 1.7–2.4)

## 2021-11-10 LAB — BLOOD GAS, VENOUS
Acid-Base Excess: 2.6 mmol/L — ABNORMAL HIGH (ref 0.0–2.0)
Bicarbonate: 27.2 mmol/L (ref 20.0–28.0)
Drawn by: 7025
O2 Saturation: 84.9 %
Patient temperature: 37
pCO2, Ven: 41 mmHg — ABNORMAL LOW (ref 44–60)
pH, Ven: 7.43 (ref 7.25–7.43)
pO2, Ven: 52 mmHg — ABNORMAL HIGH (ref 32–45)

## 2021-11-10 LAB — PHOSPHORUS: Phosphorus: 2.1 mg/dL — ABNORMAL LOW (ref 2.5–4.6)

## 2021-11-10 LAB — HEPARIN INDUCED PLATELET AB (HIT ANTIBODY): Heparin Induced Plt Ab: 0.069 OD (ref 0.000–0.400)

## 2021-11-10 MED ORDER — SODIUM CHLORIDE 3 % IN NEBU
4.0000 mL | INHALATION_SOLUTION | Freq: Every day | RESPIRATORY_TRACT | Status: DC
Start: 2021-11-10 — End: 2021-11-10
  Filled 2021-11-10: qty 4

## 2021-11-10 MED ORDER — VITAL AF 1.2 CAL PO LIQD
1000.0000 mL | ORAL | Status: DC
  Administered 2021-11-10: 1000 mL

## 2021-11-10 MED ORDER — DEXTROSE 5 % IV SOLN
30.0000 mmol | Freq: Once | INTRAVENOUS | Status: AC
Start: 2021-11-10 — End: 2021-11-10
  Administered 2021-11-10: 30 mmol via INTRAVENOUS
  Filled 2021-11-10: qty 10

## 2021-11-10 MED ORDER — DEXTROSE 5 % IV SOLN: INTRAVENOUS | Status: AC

## 2021-11-10 MED ORDER — FAMOTIDINE 20 MG PO TABS
20.0000 mg | ORAL_TABLET | Freq: Two times a day (BID) | ORAL | Status: DC
  Administered 2021-11-10 – 2021-11-16 (×12): 20 mg
  Filled 2021-11-10 (×12): qty 1

## 2021-11-10 MED ORDER — SODIUM CHLORIDE 3 % IN NEBU
4.0000 mL | INHALATION_SOLUTION | Freq: Every day | RESPIRATORY_TRACT | Status: AC
  Administered 2021-11-11 – 2021-11-13 (×3): 4 mL via RESPIRATORY_TRACT
  Filled 2021-11-10 (×3): qty 4

## 2021-11-10 NOTE — Progress Notes (Signed)
NAME:  Todd Snyder, MRN:  [id number], DOB:  [birth year], LOS: 4 ADMISSION DATE:  11/12/2021,  CHIEF COMPLAINT:  Dyspnea, weakness  History of Present Illness:  24M with a hx of autism. Was well until 2 days prior to admission when he was noted to have nausea, vomiting, poor PO intake. Experienced progressive weakness, tachypnea, tachycardia. EMS was call and he was found in respiratory distress.  In ED required intubation and MV. Eval revealed LLL PNA, acute renal failure. Also question ileus vs bowel obstruction. Transfers to [facility] with respiratory failure, septic shock and acute renal failure.   Pertinent  Medical History   Past Medical History:  Diagnosis Date   Autism    Bilateral club feet     Significant Hospital Events: Including procedures, antibiotic start and stop dates in addition to other pertinent events   CT abdomen pelvis 8/13 >>   Interim History / Subjective:  Father at bedside to provide history. No events overnight. Had BM yesterday. Patient opens eyes to voice.   Objective   Blood pressure (!) 106/57, pulse 97, temperature 99.9 F (37.7 C), temperature source Axillary, resp. rate (!) 22, height _0  (1.676 m), weight 80.3 kg, SpO2 96 %.    Vent Mode: PRVC FiO2 (%):  [30 %-50 %] 40 % Set Rate:  [22 bmp] 22 bmp Vt Set:  [510 [id number] mL] 510 mL PEEP:  [5 cmH20] 5 cmH20 Plateau Pressure:  [20 cmH20-24 cmH20] 20 cmH20   Intake/Output Summary (Last 24 hours) at 11/10/2021 2595 Last data filed at 11/10/2021 0600 Gross per 24 hour  Intake 1510.5 ml  Output 2025 ml  Net -514.5 ml   Filed Weights   11/08/21 0522 11/09/21 0500 11/10/21 0334  Weight: 76.5 kg 79.2 kg 80.3 kg    Examination: General:  chronically ill young appearing male on mech vent HEENT: MM pink/moist; ETT in place; Left eye drainage with erythema Neuro: sedated, baseline deficits. Opens eyes to voice CV: s1s2, RRR no m/r/g PULM:  dim BS bilaterally; on mech vent PRVC, crackles LLL  improving GI: soft, bsx4 diminished Extremities: warm/dry, no edema Skin: no rashes or lesions appreciated  Resolved Hospital Problem list   transaminitis  Assessment & Plan:   LLL Pneumonia:  Sepsis Tachycardia and Hypotension E coli on BAL Increased FiO2 with interval progression of opacities in some areas left side with improvement in others. Secretions vs parapneumonic effusion. -off levo -continue IV fluids -follow bcx2 and uc, resp culture - Zosyn day 5/7 -   Acute respiratory failure with hypoxia/hypercarbia due to the above Improving with mechanical ventilation.  Off sedation. Becomes very tachypneic when taken off support, likely very weak muscles. -LTVV strategy with tidal volumes of 6-8 cc/kg ideal body weight -check ABG and adjust settings accordingly -Wean PEEP/FiO2 for SpO2 >92% -VAP bundle in place -Daily SAT and SBT -PAD protocol in place -wean sedation for RASS goal 0 to -1 - pt will need PT s/p extubation  Acute encephalopathy:  Hx of ataxic cerebral palsy and autism:  baseline nonverbal; communicates with hand signals.  He is off sedation but appears to be very drowsy.  Acute component likely in setting of sepsis. -check VBG -cont abx as above  Acute renal failure AG metabolic acidosis - resolved Likely prerenal in the setting of poor PO intake and vomiting. Gap resolved. Improving with fluids Resolved  Hypernatremia Likely dehydration related given recent N/V and decreased appetite  Hypokalemia Hypophosphatemia - replete  Hypoglycemia - trickle tube feeds  Diarrhea Prior concern for ileus but no physical exam findings. Possible air from aggressive bagging prior to intubation. Minimal OG output. Gastric residuals minimal.  Large volume watery diarrhea overnight. Possibly related to discontinuation of fentanyl. He has been receiving laxatives as well. C diff negative - tube feeds trickle rate - GI panel - bowel regimen  stopped  Thrombocytopenia Anemia  DIC panel negative. Likely due to infection with Concern for drug induced thrombocytopenia 4T score low probability - check for HIT -  Zosyn - switched from PPI to Pepcid to lower risk of thrombocytopenia - DVT prophylaxis hold. SCDs  Left bacterial vs. Viral conjunctivitis P: -Polytrim 2 drops in left eye q6 for 7 days   Best Practice (right click and "Reselect all SmartList Selections" daily)   Diet/type: NPO DVT prophylaxis: prophylactic heparin  GI prophylaxis: famotidine Lines: N/A Foley:  N/A Code Status:  full code Last date of multidisciplinary goals of care discussion [8/13 spoke with father over phone. State patient is non verbal at baseline but able to make some signs with hands to communicate. Lives in [place]. ]  Delene Ruffini, MD

## 2021-11-11 DIAGNOSIS — Z9911 Dependence on respirator [ventilator] status: Secondary | ICD-10-CM | POA: Diagnosis not present

## 2021-11-11 DIAGNOSIS — J9601 Acute respiratory failure with hypoxia: Secondary | ICD-10-CM | POA: Diagnosis not present

## 2021-11-11 DIAGNOSIS — J155 Pneumonia due to Escherichia coli: Secondary | ICD-10-CM | POA: Diagnosis not present

## 2021-11-11 LAB — CBC
HCT: 30.6 % — ABNORMAL LOW (ref 39.0–52.0)
Hemoglobin: 10.1 g/dL — ABNORMAL LOW (ref 13.0–17.0)
MCH: 31.3 pg (ref 26.0–34.0)
MCHC: 33 g/dL (ref 30.0–36.0)
MCV: 94.7 fL (ref 80.0–100.0)
Platelets: 94 10*3/uL — ABNORMAL LOW (ref 150–400)
RBC: 3.23 MIL/uL — ABNORMAL LOW (ref 4.22–5.81)
RDW: 14.6 % (ref 11.5–15.5)
WBC: 7.7 10*3/uL (ref 4.0–10.5)
nRBC: 0 % (ref 0.0–0.2)

## 2021-11-11 LAB — MAGNESIUM: Magnesium: 1.7 mg/dL (ref 1.7–2.4)

## 2021-11-11 LAB — GLUCOSE, CAPILLARY
Glucose-Capillary: 93 mg/dL (ref 70–99)
Glucose-Capillary: 100 mg/dL — ABNORMAL HIGH (ref 70–99)
Glucose-Capillary: 98 mg/dL (ref 70–99)
Glucose-Capillary: 83 mg/dL (ref 70–99)
Glucose-Capillary: 105 mg/dL — ABNORMAL HIGH (ref 70–99)
Glucose-Capillary: 64 mg/dL — ABNORMAL LOW (ref 70–99)

## 2021-11-11 LAB — BASIC METABOLIC PANEL
Anion gap: 6 (ref 5–15)
BUN: 12 mg/dL (ref 6–20)
CO2: 24 mmol/L (ref 22–32)
Calcium: 7.2 mg/dL — ABNORMAL LOW (ref 8.9–10.3)
Chloride: 112 mmol/L — ABNORMAL HIGH (ref 98–111)
Creatinine, Ser: 0.72 mg/dL (ref 0.61–1.24)
GFR, Estimated: >60 mL/min (ref >60)
Glucose, Bld: 100 mg/dL — ABNORMAL HIGH (ref 70–99)
Potassium: 3.8 mmol/L (ref 3.5–5.1)
Sodium: 142 mmol/L (ref 135–145)

## 2021-11-11 LAB — PHOSPHORUS: Phosphorus: 2.6 mg/dL (ref 2.5–4.6)

## 2021-11-11 MED ORDER — DEXTROSE 50 % IV SOLN
INTRAVENOUS | Status: AC
  Administered 2021-11-11: 50 mL
  Filled 2021-11-11: qty 50

## 2021-11-11 MED ORDER — VITAL AF 1.2 CAL PO LIQD
1000.0000 mL | ORAL | Status: DC
  Administered 2021-11-12: 1000 mL

## 2021-11-11 MED ORDER — FREE WATER
200.0000 mL | Freq: Four times a day (QID) | Status: DC
  Administered 2021-11-11 – 2021-11-17 (×17): 200 mL

## 2021-11-11 MED ORDER — POTASSIUM CHLORIDE 10 MEQ/100ML IV SOLN
10.0000 meq | INTRAVENOUS | Status: AC
  Administered 2021-11-11 (×4): 10 meq via INTRAVENOUS
  Filled 2021-11-11 (×4): qty 100

## 2021-11-11 MED ORDER — METOPROLOL TARTRATE 5 MG/5ML IV SOLN
5.0000 mg | Freq: Four times a day (QID) | INTRAVENOUS | Status: DC | PRN
  Administered 2021-11-11: 5 mg via INTRAVENOUS
  Filled 2021-11-11: qty 5

## 2021-11-11 MED ORDER — MAGNESIUM SULFATE 2 GM/50ML IV SOLN
2.0000 g | Freq: Once | INTRAVENOUS | Status: AC
  Administered 2021-11-11: 2 g via INTRAVENOUS
  Filled 2021-11-11: qty 50

## 2021-11-11 MED ORDER — HEPARIN SODIUM (PORCINE) 5000 UNIT/ML IJ SOLN
5000.0000 [IU] | Freq: Three times a day (TID) | INTRAMUSCULAR | Status: DC
  Administered 2021-11-11 – 2021-11-29 (×53): 5000 [IU] via SUBCUTANEOUS
  Filled 2021-11-11 (×50): qty 1

## 2021-11-11 MED FILL — Fentanyl Citrate Preservative Free (PF) Inj 100 MCG/2ML: INTRAMUSCULAR | Qty: 1 | Status: AC

## 2021-11-11 NOTE — Progress Notes (Signed)
NAME:  Todd Snyder, MRN:  [id number], DOB:  1990/05/11, LOS: 5 ADMISSION DATE:  11/12/2021,  CHIEF COMPLAINT:  Dyspnea, weakness  History of Present Illness:  59M with a hx of autism. Was well until 2 days prior to admission when he was noted to have nausea, vomiting, poor PO intake. Experienced progressive weakness, tachypnea, tachycardia. EMS was call and he was found in respiratory distress.  In ED required intubation and MV. Eval revealed LLL PNA, acute renal failure. Also question ileus vs bowel obstruction. Transfers to [facility] with respiratory failure, septic shock and acute renal failure.   Pertinent  Medical History   Past Medical History:  Diagnosis Date   Autism    Bilateral club feet     Significant Hospital Events: Including procedures, antibiotic start and stop dates in addition to other pertinent events   CT abdomen pelvis 8/13 >>   Interim History / Subjective:  Father at bedside to provide history. No events overnight. Had BM yesterday. Patient opens eyes to voice.   Objective   Blood pressure 119/72, pulse (!) 151, temperature 99.3 F (37.4 C), temperature source Axillary, resp. rate (!) 22, height _0  (1.676 m), weight 80.5 kg, SpO2 97 %.    Vent Mode: PRVC FiO2 (%):  [40 %] 40 % Set Rate:  [22 bmp] 22 bmp Vt Set:  [510 mL] 510 mL PEEP:  [5 cmH20] 5 cmH20 Plateau Pressure:  [21 cmH20-25 cmH20] 24 cmH20   Intake/Output Summary (Last 24 hours) at 11/11/2021 1131 Last data filed at 11/11/2021 0800 Gross per 24 hour  Intake 2682.71 ml  Output 1000 ml  Net 1682.71 ml    Filed Weights   11/09/21 0500 11/10/21 0334 11/11/21 0500  Weight: 79.2 kg 80.3 kg 80.5 kg    Examination: General:  chronically ill young appearing male on mech vent HEENT: MM pink/moist; ETT in place; Left eye drainage with erythema Neuro: sedated, baseline deficits. Opens eyes to voice CV: s1s2, RRR no m/r/g PULM:  dim BS bilaterally; on mech vent PRVC, crackles LLL improving GI:  soft, bsx4 diminished Extremities: warm/dry, no edema Skin: no rashes or lesions appreciated  Resolved Hospital Problem list   transaminitis  Assessment & Plan:   LLL Pneumonia:  Sepsis Tachycardia and Hypotension E coli on BAL Increased FiO2 with interval progression of opacities in some areas left side with improvement in others. Secretions vs parapneumonic effusion. -off levo -continue IV fluids -follow bcx2 and uc, resp culture - Zosyn day 6. Will likely need extended course  Arrhythmia with tachycardia Sinus with PAC Likely due to underlying acute illness.  Rate improved with metoprolol. - will repeat 12 lead EKG now that rate has slowed - does not need AC at this time. If arrhythmia persist, can consider AC with Lovenox - replete potassium and mag  Acute respiratory failure with hypoxia/hypercarbia due to the above Improving with mechanical ventilation.  Off sedation. Becomes very tachypneic when taken off support, likely very weak muscles. -LTVV strategy with tidal volumes of 6-8 cc/kg ideal body weight -check ABG and adjust settings accordingly -Wean PEEP/FiO2 for SpO2 >92% -VAP bundle in place -Daily SAT and SBT -PAD protocol in place -wean sedation for RASS goal 0 to -1 - pt will need PT s/p extubation  Acute encephalopathy:  Hx of ataxic cerebral palsy and autism:  baseline nonverbal; communicates with hand signals.  He is off sedation but appears to be very drowsy.  Acute component likely in setting of sepsis. -cont abx  as above  Acute renal failure AG metabolic acidosis - resolved Likely prerenal in the setting of poor PO intake and vomiting. Gap resolved. Improving with fluids Resolved  Hypernatremia Resolved - decrease free water  Hypokalemia Hypophosphatemia - replete  Hypoglycemia - trickle tube feeds - check gastric residuals  Diarrhea Prior concern for ileus but no physical exam findings. Possible air from aggressive bagging prior to  intubation. Minimal OG output. Gastric residuals minimal.  Large volume watery diarrhea overnight. Possibly related to discontinuation of fentanyl. He has been receiving laxatives as well. C diff and GI panel negative - tube feeds trickle rate  - bowel regimen stopped  Thrombocytopenia Anemia  DIC panel negative. Likely due to infection with Concern for drug induced thrombocytopenia 4T score low probability HIT negative -  Zosyn - switched from PPI to Pepcid to lower risk of thrombocytopenia - start back subq heparin  Left bacterial vs. Viral conjunctivitis P: -Polytrim 2 drops in left eye q6 for 7 days   Best Practice (right click and "Reselect all SmartList Selections" daily)   Diet/type: NPO DVT prophylaxis: prophylactic heparin  GI prophylaxis: famotidine Lines: N/A Foley:  N/A Code Status:  full code Last date of multidisciplinary goals of care discussion [8/13 spoke with father over phone. State patient is non verbal at baseline but able to make some signs with hands to communicate. Lives in [place]. ]  Delene Ruffini, MD

## 2021-11-11 NOTE — TOC Progression Note (Signed)
Transition of Care [facility]) - Progression Note    Patient Details  Name: Todd Snyder MRN: [id number] Date of Birth: 1991/02/28  Transition of Care [facility]) CM/SW Contact  Beckie Busing, RN Phone [phone]  11/11/2021, 2:25 PM  Clinical Narrative:    TOC continues to follow for disposition planning when patient is medically  stable.         Expected Discharge Plan and Services                                                 Social Determinants of Health (SDOH) Interventions    Readmission Risk Interventions     No data to display

## 2021-11-12 ENCOUNTER — Inpatient Hospital Stay (HOSPITAL_COMMUNITY): Payer: Medicare Other

## 2021-11-12 DIAGNOSIS — N179 Acute kidney failure, unspecified: Secondary | ICD-10-CM | POA: Diagnosis not present

## 2021-11-12 DIAGNOSIS — A4151 Sepsis due to Escherichia coli [E. coli]: Secondary | ICD-10-CM | POA: Diagnosis not present

## 2021-11-12 DIAGNOSIS — J189 Pneumonia, unspecified organism: Secondary | ICD-10-CM | POA: Diagnosis not present

## 2021-11-12 DIAGNOSIS — Z9911 Dependence on respirator [ventilator] status: Secondary | ICD-10-CM | POA: Diagnosis not present

## 2021-11-12 DIAGNOSIS — J918 Pleural effusion in other conditions classified elsewhere: Secondary | ICD-10-CM

## 2021-11-12 LAB — BASIC METABOLIC PANEL
Anion gap: 14 (ref 5–15)
BUN: 9 mg/dL (ref 6–20)
CO2: 24 mmol/L (ref 22–32)
Calcium: 6.9 mg/dL — ABNORMAL LOW (ref 8.9–10.3)
Chloride: 105 mmol/L (ref 98–111)
Creatinine, Ser: 0.85 mg/dL (ref 0.61–1.24)
GFR, Estimated: >60 mL/min (ref >60)
Glucose, Bld: 146 mg/dL — ABNORMAL HIGH (ref 70–99)
Potassium: 3.8 mmol/L (ref 3.5–5.1)
Sodium: 143 mmol/L (ref 135–145)

## 2021-11-12 LAB — CBC
HCT: 29.8 % — ABNORMAL LOW (ref 39.0–52.0)
Hemoglobin: 9.6 g/dL — ABNORMAL LOW (ref 13.0–17.0)
MCH: 31.2 pg (ref 26.0–34.0)
MCHC: 32.2 g/dL (ref 30.0–36.0)
MCV: 96.8 fL (ref 80.0–100.0)
Platelets: 120 10*3/uL — ABNORMAL LOW (ref 150–400)
RBC: 3.08 MIL/uL — ABNORMAL LOW (ref 4.22–5.81)
RDW: 14.2 % (ref 11.5–15.5)
WBC: 7.3 10*3/uL (ref 4.0–10.5)
nRBC: 0 % (ref 0.0–0.2)

## 2021-11-12 LAB — CULTURE, BLOOD (ROUTINE X 2)
Culture: NO GROWTH
Culture: NO GROWTH
Special Requests: ADEQUATE
Special Requests: ADEQUATE

## 2021-11-12 LAB — GLUCOSE, CAPILLARY
Glucose-Capillary: 107 mg/dL — ABNORMAL HIGH (ref 70–99)
Glucose-Capillary: 108 mg/dL — ABNORMAL HIGH (ref 70–99)
Glucose-Capillary: 117 mg/dL — ABNORMAL HIGH (ref 70–99)
Glucose-Capillary: 93 mg/dL (ref 70–99)
Glucose-Capillary: 93 mg/dL (ref 70–99)
Glucose-Capillary: 97 mg/dL (ref 70–99)

## 2021-11-12 LAB — MAGNESIUM: Magnesium: 2.1 mg/dL (ref 1.7–2.4)

## 2021-11-12 LAB — PHOSPHORUS: Phosphorus: 2.7 mg/dL (ref 2.5–4.6)

## 2021-11-12 MED ORDER — FUROSEMIDE 10 MG/ML IJ SOLN
40.0000 mg | Freq: Once | INTRAMUSCULAR | Status: AC
Start: 2021-11-12 — End: 2021-11-12
  Administered 2021-11-12: 40 mg via INTRAVENOUS
  Filled 2021-11-12: qty 4

## 2021-11-12 MED ORDER — IOHEXOL 300 MG/ML  SOLN
75.0000 mL | Freq: Once | INTRAMUSCULAR | Status: AC | PRN
  Administered 2021-11-12: 75 mL via INTRAVENOUS

## 2021-11-12 NOTE — Progress Notes (Signed)
NAME:  Todd Snyder, MRN:  [id number], DOB:  05-Sep-1990, LOS: 6 ADMISSION DATE:  11/20/2021,  CHIEF COMPLAINT:  Dyspnea, weakness  History of Present Illness:  8M with a hx of autism. Was well until 2 days prior to admission when he was noted to have nausea, vomiting, poor PO intake. Experienced progressive weakness, tachypnea, tachycardia. EMS was call and he was found in respiratory distress.  In ED required intubation and MV. Eval revealed LLL PNA, acute renal failure. Also question ileus vs bowel obstruction. Transfers to [facility] with respiratory failure, septic shock and acute renal failure.   Pertinent  Medical History   Past Medical History:  Diagnosis Date   Autism    Bilateral club feet     Significant Hospital Events: Including procedures, antibiotic start and stop dates in addition to other pertinent events   8/13 admitted, intubated, bronched.  8/14 clinically no ileus or SBO, no NGT output, so TF started  Interim History / Subjective:  No emesis overnight.  Objective   Blood pressure (!) 107/57, pulse 91, temperature 99 F (37.2 C), temperature source Axillary, resp. rate (!) 22, height 5\' 6"  (1.676 m), weight 79.2 kg, SpO2 100 %.    Vent Mode: PRVC FiO2 (%):  [30 %-40 %] 30 % Set Rate:  [22 bmp] 22 bmp Vt Set:  [510 mL] 510 mL PEEP:  [5 cmH20] 5 cmH20 Plateau Pressure:  [21 cmH20-24 cmH20] 22 cmH20   Intake/Output Summary (Last 24 hours) at 11/12/2021 1038 Last data filed at 11/12/2021 0800 Gross per 24 hour  Intake 1563.25 ml  Output 2650 ml  Net -1086.75 ml    Filed Weights   11/10/21 0334 11/11/21 0500 11/12/21 0500  Weight: 80.3 kg 80.5 kg 79.2 kg    Examination: General:  critically ill appearing man lying in bed in NAD HEENT: [place]/AT, eyes anicteric, ETT in place Neuro: more alert, looking around the room today. Still not moving much. CV: S1S2, RRR PULM:  synchronous with MV, mild secretions GI: soft, NT Extremities: no cyanosis or edema Skin:  no rashes, skin warm & dry  BUN 9 Cr 0.85 Phos 2.7 WBC 7.3 H/H 9.6/29.8 CXR personally reviewed> still dense consolidation on the left CT chest personally reviewed> posteriorly positioned pleural effusion, loculated with overlying necrotizing pneumonia Bedside 02-10-1993: small component of lateral effusion but not a large enough area to access with [place] and positioning in bed  Resolved Hospital Problem list   Transaminase elevation AKI Hypernatremia Hypokalemia Hypophosphatemia   Assessment & Plan:   Acute respiratory failure due to LLL E Coli Pneumonia -LTVV -VAP prevention protocol -PAD protocol for sedation; not currently needing any -daily SAT & SBT> failed due to NM weakness -chest CT today to evaluate for pleural effusion not able to be seen on US> IR consult placed for CT-guided chest tube placement.  May need tPA and dornase. -con't zosyn  Sepsis; shock resolved -con't zosyn -Follow repeat trach aspirate cultures - Need source control of pleural effusion  Gastroenteritis, present before admission.  No recent vomiting or diarrhea -Banatrol on hold today, restart if liquid bowel movement - Not ready to start bowel regimen back yet. - Monitor  AKI resolved -Strict I/os -renally dose meds and avoid nephrotoxic medications -Monitor  Supraventricular arrhythmia with tachycardia 2/2 sepsis-resolved -Monitoring telemetry - Monitor electrolytes and replete as needed  Acute metabolic encephalopathy due to sepsis Hx of ataxic cerebral palsy and autism:  Baseline nonverbal; communicates with hand signals. Can mostly feed himself (spoon  more easily than fork), can dress himself with minimal assistance.  -minimize sedation as able -Anticipate a prolonged recovery  At risk for malnutrition -Continue tube feeds, monitor gastric residuals is advancing towards goal. - Continue postpyloric feeding via core track   Thrombocytopenia, likely 2/2 sepsis vs antibitoics Anemia due  to critical illness 4T score low probability & HIT Ab negative - con't zosyn -switched from PPI> pepcid - [place] heparin  Left bacterial vs. Viral conjunctivitis -eye drops   Mr. Yepez's father was updated at bedside.  Discussed need for CT-guided chest tube and potential need for tPA and dornase if fluid is not fully drained.  Best Practice (right click and "Reselect all SmartList Selections" daily)   Diet/type: tubefeeds DVT prophylaxis: prophylactic heparin  GI prophylaxis: famotidine Lines: Central line Foley:  N/A Code Status:  full code Last date of multidisciplinary goals of care discussion [father updated daily at bedside ]  This patient is critically ill with multiple organ system failure which requires frequent high complexity decision making, assessment, support, evaluation, and titration of therapies. This was completed through the application of advanced monitoring technologies and extensive interpretation of multiple databases. During this encounter critical care time was devoted to patient care services described in this note for 50 minutes.    Steffanie Dunn, DO 11/12/21 4:18 PM [facility]

## 2021-11-12 NOTE — Progress Notes (Signed)
Pt transported to CT and back to 2M14 on full vent support. No complications noted.

## 2021-11-13 ENCOUNTER — Inpatient Hospital Stay (HOSPITAL_COMMUNITY): Payer: Medicare Other

## 2021-11-13 DIAGNOSIS — Z9911 Dependence on respirator [ventilator] status: Secondary | ICD-10-CM | POA: Diagnosis not present

## 2021-11-13 DIAGNOSIS — J90 Pleural effusion, not elsewhere classified: Secondary | ICD-10-CM | POA: Diagnosis not present

## 2021-11-13 DIAGNOSIS — J9601 Acute respiratory failure with hypoxia: Secondary | ICD-10-CM | POA: Diagnosis not present

## 2021-11-13 DIAGNOSIS — A4151 Sepsis due to Escherichia coli [E. coli]: Secondary | ICD-10-CM | POA: Diagnosis not present

## 2021-11-13 LAB — GLUCOSE, CAPILLARY
Glucose-Capillary: 78 mg/dL (ref 70–99)
Glucose-Capillary: 81 mg/dL (ref 70–99)
Glucose-Capillary: 82 mg/dL (ref 70–99)
Glucose-Capillary: 88 mg/dL (ref 70–99)
Glucose-Capillary: 98 mg/dL (ref 70–99)
Glucose-Capillary: 98 mg/dL (ref 70–99)

## 2021-11-13 LAB — CBC
HCT: 28.3 % — ABNORMAL LOW (ref 39.0–52.0)
Hemoglobin: 9.5 g/dL — ABNORMAL LOW (ref 13.0–17.0)
MCH: 31.7 pg (ref 26.0–34.0)
MCHC: 33.6 g/dL (ref 30.0–36.0)
MCV: 94.3 fL (ref 80.0–100.0)
Platelets: 159 10*3/uL (ref 150–400)
RBC: 3 MIL/uL — ABNORMAL LOW (ref 4.22–5.81)
RDW: 14.2 % (ref 11.5–15.5)
WBC: 7.8 10*3/uL (ref 4.0–10.5)
nRBC: 0 % (ref 0.0–0.2)

## 2021-11-13 LAB — GRAM STAIN

## 2021-11-13 LAB — PHOSPHORUS: Phosphorus: 3.5 mg/dL (ref 2.5–4.6)

## 2021-11-13 LAB — MAGNESIUM: Magnesium: 1.9 mg/dL (ref 1.7–2.4)

## 2021-11-13 MED ORDER — MIDAZOLAM HCL 2 MG/2ML IJ SOLN
INTRAMUSCULAR | Status: AC | PRN
  Administered 2021-11-13: .5 mg via INTRAVENOUS
  Administered 2021-11-13: 1.5 mg via INTRAVENOUS

## 2021-11-13 MED ORDER — FENTANYL CITRATE (PF) 100 MCG/2ML IJ SOLN
INTRAMUSCULAR | Status: AC | PRN
  Administered 2021-11-13 (×2): 50 ug via INTRAVENOUS

## 2021-11-13 MED ORDER — FENTANYL CITRATE (PF) 100 MCG/2ML IJ SOLN
INTRAMUSCULAR | Status: AC
  Filled 2021-11-13: qty 2

## 2021-11-13 MED ORDER — MIDAZOLAM HCL 2 MG/2ML IJ SOLN
INTRAMUSCULAR | Status: AC
  Filled 2021-11-13: qty 2

## 2021-11-13 MED ORDER — SODIUM CHLORIDE 0.9% IV SOLUTION: Freq: Once | INTRAVENOUS | Status: DC

## 2021-11-13 MED ORDER — LIDOCAINE-EPINEPHRINE 1 %-1:100000 IJ SOLN
INTRAMUSCULAR | Status: AC
  Filled 2021-11-13: qty 1

## 2021-11-13 NOTE — Progress Notes (Signed)
Pt transported to IR and back to 65M 14 on full vent support. No complications noted.

## 2021-11-13 NOTE — Procedures (Signed)
Interventional Radiology Procedure Note  Procedure: Image guided left chest tube  placement, 3F pigtail drain.  Complications: None  EBL: None Sample: Culture sent  Recommendations: - Routine chest tube care  record output. 66mmH20 suction.  Standard chest tube orders - follow up Cx - routine wound care  Signed,  Yvone Neu. Loreta Ave, DO

## 2021-11-13 NOTE — Progress Notes (Signed)
NAME:  Todd Snyder, MRN:  [id number], DOB:  1990-08-15, LOS: 7 ADMISSION DATE:  2021/11/15,  CHIEF COMPLAINT:  Dyspnea, weakness  History of Present Illness:  89M with a hx of autism. Was well until 2 days prior to admission when he was noted to have nausea, vomiting, poor PO intake. Experienced progressive weakness, tachypnea, tachycardia. EMS was call and he was found in respiratory distress.  In ED required intubation and MV. Eval revealed LLL PNA, acute renal failure. Also question ileus vs bowel obstruction. Transfers to [facility] with respiratory failure, septic shock and acute renal failure.   Pertinent  Medical History   Past Medical History:  Diagnosis Date   Autism    Bilateral club feet     Significant Hospital Events: Including procedures, antibiotic start and stop dates in addition to other pertinent events   8/13 admitted, intubated, bronched.  8/14 clinically no ileus or SBO, no NGT output, so TF started 8/19 CXR not better despite CPT & nebs. CT shows posterior predominant loculated effusion. Still not amenable to bedside placement due to placement of effusion. IR consulted for CT-guided chest tube.  Interim History / Subjective:  Overnight no acute events.   Objective   Blood pressure 113/65, pulse (!) 105, temperature 99.3 F (37.4 C), temperature source Axillary, resp. rate (!) 22, height 5\' 6"  (1.676 m), weight 78.4 kg, SpO2 100 %.    Vent Mode: PRVC FiO2 (%):  [30 %-40 %] 40 % Set Rate:  [22 bmp] 22 bmp Vt Set:  [510 mL] 510 mL PEEP:  [5 cmH20] 5 cmH20 Plateau Pressure:  [21 cmH20-24 cmH20] 23 cmH20   Intake/Output Summary (Last 24 hours) at 11/13/2021 0900 Last data filed at 11/13/2021 0600 Gross per 24 hour  Intake 430 ml  Output 2750 ml  Net -2320 ml    Filed Weights   11/11/21 0500 11/12/21 0500 11/13/21 0500  Weight: 80.5 kg 79.2 kg 78.4 kg    Examination: General:  critically ill appearing man lying in bed in NAD HEENT: [place]/AT, eyes anicteric,  oral mucosa moist. ETT in place. Neuro: alert, looking around the room, not moving much but moves when stimulated. CV: S1S2, RRR PULM:  synchronous with MV, CTAB anteriorly GI: soft, NT, TF on hold for procedure Extremities: mild pedal edema, no cyanosis Skin: no rashes, skin warm & dry  Mg + 1.9 Phos 3.5 WBC 7.8 H/H 9.5/28.3 Trach aspirate> pending  Resolved Hospital Problem list   Transaminase elevation AKI Hypernatremia Hypokalemia Hypophosphatemia thrombocytopenia  Assessment & Plan:   Acute respiratory failure due to LLL E Coli Pneumonia, complicated by loculated pleural effusion posteriorly -planning for IR chest tube today   -LTVV -VAP prevention protocol -PAD protocol for sedation -daily SAT & SBT as appropriate; has severe weakness that has been limiting vent weaning -con't zosyn; anticipate he will need a prolonged course with limited radiographic improvement in infiltrates, development of cavitary lesions within infiltrates, need to sterilize pleural space  Sepsis; shock resolved --zosyn; need for prolonged course -Follow repeat trach aspirate cultures from 8/17. - Need source control of pleural effusion> IR chest tube today. Not amenable to bedside drainage despite multiple providers attempts  Gastroenteritis, present before admission.  No recent vomiting or diarrhea -banatrol on hold, can resume if diarrhea returns - resume daily miralax -monitor for TF intolerance  AKI resolved -con't to monitor -strict I/Os  Supraventricular arrhythmia with tachycardia 2/2 sepsis-resolved -tele monitoring  Acute metabolic encephalopathy due to sepsis Hx of ataxic cerebral  palsy and autism:  Baseline nonverbal; communicates with hand signals. Can mostly feed himself (spoon more easily than fork), can dress himself with minimal assistance.  -minimize sedation as able -Anticipate a prolonged recovery  At risk for malnutrition -TF can resume after chest tube  placement - Continue postpyloric feeding via core track  Thrombocytopenia, likely 2/2 sepsis vs antibitoics> resolved Anemia due to critical illness 4T score low probability & HIT Ab negative - Zosyn -switched from PPI> pepcid -[place] heparin  Left bacterial vs. Viral conjunctivitis -con't antibiotic eye drops    Best Practice (right click and "Reselect all SmartList Selections" daily)   Diet/type: tubefeeds- on hold fo prrocedure DVT prophylaxis: prophylactic heparin  GI prophylaxis: famotidine Lines: Central line> hopefully can remove later today Foley:  N/A Code Status:  full code Last date of multidisciplinary goals of care discussion [father updated daily at bedside ]  This patient is critically ill with multiple organ system failure which requires frequent high complexity decision making, assessment, support, evaluation, and titration of therapies. This was completed through the application of advanced monitoring technologies and extensive interpretation of multiple databases. During this encounter critical care time was devoted to patient care services described in this note for 38 minutes.    Steffanie Dunn, DO 11/13/21 9:35 AM [facility]

## 2021-11-13 NOTE — Progress Notes (Signed)
Father updated at bedside this afternoon.  Steffanie Dunn, DO 11/13/21 4:01 PM [facility]

## 2021-11-13 NOTE — Progress Notes (Deleted)
eLink Physician-Brief Progress Note Patient Name: Todd Snyder DOB: 1990/07/27 MRN: [id number]   Date of Service  11/13/2021  HPI/Events of Note  Hgb 6.9  eICU Interventions  Transfuse 1 unit prbc     Intervention Category Intermediate Interventions: Bleeding - evaluation and treatment with blood products  Henry Russel, P 11/13/2021, 4:48 AM

## 2021-11-13 NOTE — Consult Note (Signed)
Chief Complaint: Patient was seen in consultation today for left chest tube placement Chief Complaint  Patient presents with   Respiratory Distress   at the request of Dr Josie Dixon  Supervising Physician: Corrie Mckusick  Patient Status: [facility] - In-pt  History of Present Illness: Todd Snyder is a 31 y.o. male   Hx Autism Admitted 8/13 with N/V and poor intake Progressive weakness Respiratory distress--- +intubated in ED LLL PNA; sepsis Acute renal failure  Bedside US per PCCM:  Bedside US: small component of lateral effusion but not a large enough area to access with [place] and positioning in bed  CT yesterday:IMPRESSION: 1. Bilateral ill-defined patchy and nodular airspace disease suggesting multifocal pneumonia. 2. Consolidative disease in the lingula shows associated cavitation, consistent with necrotic pneumonia. 3. Moderate left and small right pleural effusions.  Request made for IR L Chest tube placement Dr Earleen Newport has reviewed imaging and approves procedure  Past Medical History:  Diagnosis Date   Autism    Bilateral club feet     Allergies: Latex  Medications: Prior to Admission medications   Medication Sig Start Date End Date Taking? Authorizing Provider  loratadine (CLARITIN) 10 MG tablet Take 10 mg by mouth daily.   Yes [provider]     No family history on file.  Social History   Socioeconomic History   Marital status: Single    Spouse name: Not on file   Number of children: Not on file   Years of education: Not on file   Highest education level: Not on file  Occupational History   Not on file  Tobacco Use   Smoking status: Not on file   Smokeless tobacco: Not on file  Substance and Sexual Activity   Alcohol use: Not on file   Drug use: Not on file   Sexual activity: Not on file  Other Topics Concern   Not on file  Social History Narrative   Not on file   Social Determinants of Health   Financial Resource  Strain: Not on file  Food Insecurity: Not on file  Transportation Needs: Not on file  Physical Activity: Not on file  Stress: Not on file  Social Connections: Not on file    Review of Systems: A 12 point ROS discussed and pertinent positives are indicated in the HPI above.  All other systems are negative.  Vital Signs: BP 113/65 (BP Location: Left Arm)   Pulse (!) 105   Temp 99.3 F (37.4 C) (Axillary)   Resp (!) 22   Ht 5\' 6"  (1.676 m)   Wt 172 lb 13.5 oz (78.4 kg)   SpO2 97%   BMI 27.90 kg/m     Physical Exam Vitals reviewed.  Cardiovascular:     Rate and Rhythm: Normal rate.  Pulmonary:     Comments: Intubated - vent Psychiatric:     Comments: Spoke to father Quitman Livings- he consents for procedure     Imaging: CT CHEST W CONTRAST  Result Date: 11/12/2021 CLINICAL DATA:  Pneumonia.  Pleural effusion. EXAM: CT CHEST WITH CONTRAST TECHNIQUE: Multidetector CT imaging of the chest was performed during intravenous contrast administration. RADIATION DOSE REDUCTION: This exam was performed according to the departmental dose-optimization program which includes automated exposure control, adjustment of the mA and/or kV according to patient size and/or use of iterative reconstruction technique. CONTRAST:  54mL OMNIPAQUE IOHEXOL 300 MG/ML  SOLN COMPARISON:  10/25/2009 FINDINGS: Cardiovascular: The heart size is normal. No substantial  pericardial effusion. No thoracic aortic aneurysm. No substantial atherosclerosis of the thoracic aorta. Mediastinum/Nodes: No mediastinal lymphadenopathy. NG tube and feeding tube noted in the esophagus. There is no hilar lymphadenopathy. There is no axillary lymphadenopathy. Lungs/Pleura: Septal thickening with patchy nodular opacity in the left apex is associated with more confluent airspace disease in the lingula with areas of lung parenchymal cavitation within the consolidated left upper lobe. There is associated left lower lobe  collapse/consolidation. Multiple ill-defined nodular opacities are seen in the right upper and lower lobes with volume loss and consolidation in the right middle lobe. Moderate left and small right pleural effusions evident. Upper Abdomen: NG tube tip is in the gastric fundus. Feeding tube tip is just distal to the ligament of Treitz. Gallbladder is nondistended with appearance of gallbladder wall thickening. Musculoskeletal: No worrisome lytic or sclerotic osseous abnormality. IMPRESSION: 1. Bilateral ill-defined patchy and nodular airspace disease suggesting multifocal pneumonia. 2. Consolidative disease in the lingula shows associated cavitation, consistent with necrotic pneumonia. 3. Moderate left and small right pleural effusions. 4. Gallbladder wall thickening although gallbladder is underdistended. No gallstones evident by CT imaging. Acalculous cholecystitis could have this appearance and gallbladder wall thickness is likely accentuated by underdistention. 5. Feeding tube tip is just distal to the ligament of Treitz. NG tube tip is in the stomach. Electronically Signed   By: Kennith Center M.D.   On: 11/12/2021 12:35   DG CHEST PORT 1 VIEW  Result Date: 11/12/2021 CLINICAL DATA:  Respirator dependent. EXAM: PORTABLE CHEST 1 VIEW COMPARISON:  11/10/2021 FINDINGS: Right IJ catheter tip projects over the SVC. ET tube tip is above the carina. There is a feeding tube and NG tube with tips well below the hemidiaphragms. There is dense asymmetric opacification within the left midlung and left lower lung which appears similar to the previous exam. Patchy opacities within the lateral right lung base are stable. IMPRESSION: 1. No significant change in aeration to the left midlung and left base. 2. Stable support apparatus. Electronically Signed   By: Signa Kell M.D.   On: 11/12/2021 08:23   DG CHEST PORT 1 VIEW  Result Date: 11/10/2021 CLINICAL DATA:  Hypoxia. Possible aspiration when vomited yesterday.  EXAM: PORTABLE CHEST 1 VIEW COMPARISON:  12-02-21 FINDINGS: Endotracheal tube in satisfactory position. Feeding tube extending into the stomach with its tip not included. Nasogastric tube tip and side hole in the proximal stomach. Right jugular catheter tip in the proximal superior vena cava. Extensive airspace opacity in the left lung with some areas of improvement in some areas increased with areas an interval more linear appearance. Interval minimal patchy opacity in the right lower lung zone. Probable left pleural effusion with improvement. No significant change in moderate dextroconvex thoracic scoliosis. IMPRESSION: 1. Areas of atelectasis and possible pneumonia in the left lung some areas improved in others worse. 2. Interval minimal interval mild pulmonary edema or developing pneumonia in the right lower lung zone. 3. Probable left pleural effusion with improvement. Electronically Signed   By: Beckie Salts M.D.   On: 11/10/2021 09:11   DG Abd Portable 1V  Result Date: 11/09/2021 CLINICAL DATA:  Feeding tube placement EXAM: PORTABLE ABDOMEN - 1 VIEW COMPARISON:  November 07, 2021 FINDINGS: There are 2 feeding tubes in place. The feeding tube with the radiopaque stiffener still in place is coiled within the stomach, with the tip near the fundus. The feeding tube without the radiopaque stiffener in place terminates near the pylorus. The bowel gas pattern is normal.  No radio-opaque calculi or other significant radiographic abnormality are seen. IMPRESSION: Two feeding tubes in place, 1 that is coiled in the stomach terminating near the fundus and 1 that terminates near the pylorus. Electronically Signed   By: Beryle Flock M.D.   On: 11/09/2021 14:19   DG Abd 1 View  Result Date: 11/07/2021 CLINICAL DATA:  Ileus EXAM: ABDOMEN - 1 VIEW COMPARISON:  11/16/2021 FINDINGS: 2 supine frontal views of the abdomen and pelvis excludes portions of the hemidiaphragms by collimation. Enteric catheter coiled over  the region of the gastric fundus. Continued dilated gas-filled loops of small bowel within the central abdomen measuring up to 3.6 cm. There is a paucity of distal bowel gas. No masses or abnormal calcifications. IMPRESSION: 1. Enteric catheter tip projecting over the gastric fundus. 2. Stable gaseous distention of the small bowel with a paucity of distal bowel gas, consistent with previous findings of early/intermittent obstruction or ileus. Electronically Signed   By: Randa Ngo M.D.   On: 11/07/2021 12:48   DG Chest Port 1 View  Result Date: 11/03/2021 CLINICAL DATA:  Check central line placement, initial encounter EXAM: PORTABLE CHEST 1 VIEW COMPARISON:  Film from earlier in the same day. FINDINGS: Endotracheal tube, gastric catheter and right jugular central line are again seen in satisfactory position. Increasing airspace opacity and effusion on the left is noted. Right lung remains clear. No bony abnormality is noted. IMPRESSION: Tubes and lines in satisfactory position. Increasing density in the left base likely a combination of effusion and airspace consolidation. Electronically Signed   By: Inez Catalina M.D.   On: 10/28/2021 20:29   CT ABDOMEN PELVIS WO CONTRAST  Result Date: 11/13/2021 CLINICAL DATA:  Respiratory distress and emesis. EXAM: CT ABDOMEN AND PELVIS WITHOUT CONTRAST TECHNIQUE: Multidetector CT imaging of the abdomen and pelvis was performed following the standard protocol without IV contrast. RADIATION DOSE REDUCTION: This exam was performed according to the departmental dose-optimization program which includes automated exposure control, adjustment of the mA and/or kV according to patient size and/or use of iterative reconstruction technique. COMPARISON:  Same day abdominal radiograph. FINDINGS: Lower chest: Left lower lobe consolidation with air bronchograms and multifocal airspace opacities in the right middle and lower lobes. Small hiatal hernia. Hepatobiliary: Unremarkable  noncontrast enhanced appearance of the liver and gallbladder. No biliary ductal dilation. Pancreas: No pancreatic ductal dilation or evidence of acute inflammation. Spleen: No splenomegaly. Adrenals/Urinary Tract: Bilateral adrenal glands appear normal. No hydronephrosis. Punctate nonobstructive bilateral renal stones. Gas in the urinary bladder which is decompressed around a Foley catheter. Stomach/Bowel: No radiopaque enteric contrast material was administered. Nasogastric tube with tip in the stomach. Stomach is distended with fluid and gas without focal wall thickening. Duodenum and proximal jejunum are not dilated. There are dilated gas and fluid-filled loops of small bowel in the anterior abdomen measuring up to 3.5 cm extending to the terminal ileum without abrupt transition to nondilated small bowel. There gas fluid levels in the proximal colon with decompressed mid/distal colon. Small amount of fluid and stool in the rectum Vascular/Lymphatic: No abdominal aortic aneurysm. No pathologically enlarged abdominal or pelvic lymph nodes. Reproductive: Prostate is unremarkable. Other: No significant abdominopelvic free fluid. Linear bands of soft tissue stranding extending from the skin surface to the bilateral ischial tuberosities without drainable fluid collection gas or cutaneous skin defect. Musculoskeletal: No acute osseous abnormality. IMPRESSION: 1. Dilated gas and fluid-filled loops of mid/distal small bowel extending to the terminal ileum without abrupt transition to nondilated bowel.  Additionally there is nondistended proximal small bowel and gas fluid levels in the proximal colon with decompressed mid/distal colon. Constellation of findings most likely reflecting ileus/enteritis although early/partial small bowel obstruction is not entirely excluded. 2. Left lower lobe consolidation with air bronchograms and multifocal opacities in the right lung consistent with multifocal pneumonia. 3. Nasogastric tube  with tip in the stomach. 4. Linear bands of soft tissue extending from the cutaneous surface to the bilateral ischial tuberosities without drainable fluid collection or containing skin defect identified commonly reflect sequela of prolonged sedentary positioning. Electronically Signed   By: Maudry Mayhew M.D.   On: 11/17/2021 18:38   DG Abdomen 1 View  Result Date: 11/22/2021 CLINICAL DATA:  Altered mental status and weak pulse. EXAM: ABDOMEN - 1 VIEW COMPARISON:  Radiograph November 15, 2009. FINDINGS: Nasogastric tube with tip and side port projecting over the stomach. Dilated loops of proximal small bowel measure up to 4.1 cm. IMPRESSION: 1. Radiographic findings consistent with small bowel obstruction suggest further evaluation with CT abdomen and pelvis. 2. Nasogastric tube with tip and side port projecting over the stomach. Electronically Signed   By: Maudry Mayhew M.D.   On: 11/13/2021 16:59   DG Chest Port 1 View  Result Date: 10/29/2021 CLINICAL DATA:  Questionable sepsis. EXAM: PORTABLE CHEST 1 VIEW COMPARISON:  Chest x-ray 11/25/2009 FINDINGS: Endotracheal tube tip is 4.5 cm above the carina. Right-sided central venous catheter tip projects over the proximal SVC. Nasogastric tube is coiled in the fundus of the stomach. There is a moderate-sized left pleural effusion. There is patchy left mid and lower lung airspace consolidation. Right lung is clear. There is no pneumothorax. The cardiomediastinal silhouette is within normal limits. No acute fractures are seen. IMPRESSION: 1. Left mid and lower lung airspace consolidation. 2. Moderate left pleural effusion. 3. Lines and tubes as above. Electronically Signed   By: Darliss Cheney M.D.   On: 11/20/2021 16:52    Labs:  CBC: Recent Labs    11/10/21 0335 11/11/21 0251 11/12/21 0436 11/13/21 0455  WBC 8.0 7.7 7.3 7.8  HGB 10.1* 10.1* 9.6* 9.5*  HCT 30.6* 30.6* 29.8* 28.3*  PLT 81* 94* 120* 159    COAGS: Recent Labs    11/09/2021 1613  11/09/21 0742  INR 1.9* 1.3*  APTT 40* 59*    BMP: Recent Labs    11/09/21 0206 11/10/21 0335 11/11/21 0251 11/12/21 0436  NA 148* 148* 142 143  K 3.2* 3.5 3.8 3.8  CL 115* 116* 112* 105  CO2 25 25 24 24   GLUCOSE 123* 89 100* 146*  BUN 35* 20 12 9   CALCIUM 7.2* 7.4* 7.2* 6.9*  CREATININE 1.13 0.90 0.72 0.85  GFRNONAA >60 >60 >60 >60    LIVER FUNCTION TESTS: Recent Labs    11/24/2021 1534 11/07/21 0519 11/10/21 0335  BILITOT 1.3* 1.3* 1.5*  AST 84* 67* 50*  ALT 45* 39 32  ALKPHOS 85 65 131*  PROT 6.7 4.9* 4.5*  ALBUMIN 3.2* 2.0* 1.7*    TUMOR MARKERS: No results for input(s): "AFPTM", "CEA", "CA199", "CHROMGRNA" in the last 8760 hours.  Assessment and Plan:  Left loculated pleural effusion Scheduled for left chest tube placement in IR today Pts father 11/09/21 is aware of procedure benefits and risks including but not limited to Infection; bleeding; pneumothorax; death He is agreeable to proceed Consent signed and in chart  Thank you for this interesting consult.  I greatly enjoyed meeting Todd Snyder and look forward to participating  in their care.  A copy of this report was sent to the requesting provider on this date.  Electronically Signed: Lavonia Drafts, PA-C 11/13/2021, 9:19 AM   I spent a total of 20 Minutes    in face to face in clinical consultation, greater than 50% of which was counseling/coordinating care for left chest tube placement

## 2021-11-14 ENCOUNTER — Inpatient Hospital Stay (HOSPITAL_COMMUNITY): Payer: Medicare Other

## 2021-11-14 DIAGNOSIS — A4151 Sepsis due to Escherichia coli [E. coli]: Secondary | ICD-10-CM | POA: Diagnosis not present

## 2021-11-14 DIAGNOSIS — J9601 Acute respiratory failure with hypoxia: Secondary | ICD-10-CM | POA: Diagnosis not present

## 2021-11-14 LAB — BASIC METABOLIC PANEL
Anion gap: 8 (ref 5–15)
BUN: 10 mg/dL (ref 6–20)
CO2: 25 mmol/L (ref 22–32)
Calcium: 8 mg/dL — ABNORMAL LOW (ref 8.9–10.3)
Chloride: 110 mmol/L (ref 98–111)
Creatinine, Ser: 0.65 mg/dL (ref 0.61–1.24)
GFR, Estimated: >60 mL/min (ref >60)
Glucose, Bld: 111 mg/dL — ABNORMAL HIGH (ref 70–99)
Potassium: 3.5 mmol/L (ref 3.5–5.1)
Sodium: 143 mmol/L (ref 135–145)

## 2021-11-14 LAB — CBC
HCT: 28.2 % — ABNORMAL LOW (ref 39.0–52.0)
Hemoglobin: 9.2 g/dL — ABNORMAL LOW (ref 13.0–17.0)
MCH: 31.4 pg (ref 26.0–34.0)
MCHC: 32.6 g/dL (ref 30.0–36.0)
MCV: 96.2 fL (ref 80.0–100.0)
Platelets: 234 10*3/uL (ref 150–400)
RBC: 2.93 MIL/uL — ABNORMAL LOW (ref 4.22–5.81)
RDW: 13.6 % (ref 11.5–15.5)
WBC: 7.3 10*3/uL (ref 4.0–10.5)
nRBC: 0 % (ref 0.0–0.2)

## 2021-11-14 LAB — GLUCOSE, CAPILLARY
Glucose-Capillary: 102 mg/dL — ABNORMAL HIGH (ref 70–99)
Glucose-Capillary: 105 mg/dL — ABNORMAL HIGH (ref 70–99)
Glucose-Capillary: 106 mg/dL — ABNORMAL HIGH (ref 70–99)
Glucose-Capillary: 107 mg/dL — ABNORMAL HIGH (ref 70–99)
Glucose-Capillary: 116 mg/dL — ABNORMAL HIGH (ref 70–99)
Glucose-Capillary: 120 mg/dL — ABNORMAL HIGH (ref 70–99)

## 2021-11-14 LAB — PREPARE RBC (CROSSMATCH)

## 2021-11-14 MED ORDER — FUROSEMIDE 10 MG/ML IJ SOLN
20.0000 mg | Freq: Once | INTRAMUSCULAR | Status: AC
  Administered 2021-11-14: 20 mg via INTRAVENOUS
  Filled 2021-11-14: qty 2

## 2021-11-14 MED ORDER — MAGNESIUM SULFATE 2 GM/50ML IV SOLN
2.0000 g | Freq: Once | INTRAVENOUS | Status: AC
Start: 2021-11-14 — End: 2021-11-14
  Administered 2021-11-14: 2 g via INTRAVENOUS
  Filled 2021-11-14: qty 50

## 2021-11-14 MED ORDER — VITAL AF 1.2 CAL PO LIQD
1000.0000 mL | ORAL | Status: DC
  Administered 2021-11-14 – 2021-11-27 (×17): 1000 mL
  Filled 2021-11-14 (×12): qty 1000

## 2021-11-14 MED ORDER — POTASSIUM CHLORIDE 20 MEQ PO PACK
40.0000 meq | PACK | Freq: Once | ORAL | Status: AC
Start: 2021-11-14 — End: 2021-11-14
  Administered 2021-11-14: 40 meq
  Filled 2021-11-14: qty 2

## 2021-11-14 NOTE — Progress Notes (Signed)
NAME:  CECILIO OHLRICH, MRN:  [id number], DOB:  12-24-1990, LOS: 8 ADMISSION DATE:  11/05/2021,  CHIEF COMPLAINT:  Dyspnea, weakness  History of Present Illness:  13M with a hx of autism. Was well until 2 days prior to admission when he was noted to have nausea, vomiting, poor PO intake. Experienced progressive weakness, tachypnea, tachycardia. EMS was call and he was found in respiratory distress.  In ED required intubation and MV. Eval revealed LLL PNA, acute renal failure. Also question ileus vs bowel obstruction. Transfers to [facility] with respiratory failure, septic shock and acute renal failure.   Pertinent  Medical History   Past Medical History:  Diagnosis Date   Autism    Bilateral club feet     Significant Hospital Events: Including procedures, antibiotic start and stop dates in addition to other pertinent events   8/13 admitted, intubated, bronched.  8/14 clinically no ileus or SBO, no NGT output, so TF started 8/19 CXR not better despite CPT & nebs. CT shows posterior predominant loculated effusion. Still not amenable to bedside placement due to placement of effusion. IR consulted for CT-guided chest tube.  Interim History / Subjective:  Overnight no acute events.   Objective   Blood pressure (!) 112/58, pulse (!) 110, temperature 99.1 F (37.3 C), temperature source Axillary, resp. rate 18, height 5\' 6"  (1.676 m), weight 78.4 kg, SpO2 94 %.    Vent Mode: PRVC FiO2 (%):  [30 %-40 %] 30 % Set Rate:  [18 bmp] 18 bmp Vt Set:  [510 mL] 510 mL PEEP:  [5 cmH20] 5 cmH20 Plateau Pressure:  [14 cmH20-22 cmH20] 20 cmH20   Intake/Output Summary (Last 24 hours) at 11/14/2021 1347 Last data filed at 11/14/2021 1127 Gross per 24 hour  Intake 1030.5 ml  Output 1050 ml  Net -19.5 ml    Filed Weights   11/11/21 0500 11/12/21 0500 11/13/21 0500  Weight: 80.5 kg 79.2 kg 78.4 kg    Examination: General:  critically ill appearing man lying in bed in NAD HEENT: [place]/AT, eyes  anicteric, oral mucosa moist. ETT in place. Neuro: alert, looking around the room, not moving much but moves when stimulated. CV: S1S2, RRR PULM:  synchronous with MV, CTAB anteriorly, chest tube posteriorly with 500cc drainage output GI: soft, NT, TF on hold for procedure Extremities: mild pedal edema, no cyanosis Skin: no rashes, skin warm & dry  Resolved Hospital Problem list   Transaminase elevation AKI Hypernatremia Hypokalemia Hypophosphatemia thrombocytopenia  Assessment & Plan:   Acute respiratory failure due to LLL E Coli Pneumonia, complicated by loculated pleural effusion posteriorly and pneumothorax S/p chest tube for necrotic portion of lung. Pneumothorax noted LLL.  Patient taking rapid shallow breaths when support is removed. Lungs are quite weak and have not fully recovered from infection at this time. Unable to wean at this time. -LTVV -VAP prevention protocol -PAD protocol for sedation -daily SAT & SBT as appropriate; has severe weakness that has been limiting vent weaning -con't zosyn; anticipate he will need a prolonged course with limited radiographic improvement in infiltrates, development of cavitary lesions within infiltrates, need to sterilize pleural space - chest tube maintenance  Sepsis; shock resolved Chest tube placed 8/20 with 500cc output.  --zosyn; need for prolonged course -Follow repeat trach aspirate cultures from 8/17.  Gastroenteritis, present before admission.  No recent vomiting or diarrhea -banatrol on hold, can resume if diarrhea returns - resume daily miralax -monitor for TF intolerance  AKI resolved -con't to monitor -strict  I/Os  Supraventricular arrhythmia with tachycardia 2/2 sepsis-resolved -tele monitoring  Acute metabolic encephalopathy due to sepsis Hx of ataxic cerebral palsy and autism:  Baseline nonverbal; communicates with hand signals. Can mostly feed himself (spoon more easily than fork), can dress himself with  minimal assistance.  -minimize sedation as able -Anticipate a prolonged recovery  At risk for malnutrition -TF can resume after chest tube placement - Continue postpyloric feeding via core track at full rate  Thrombocytopenia, likely 2/2 sepsis vs antibitoics> resolved Anemia due to critical illness 4T score low probability & HIT Ab negative - Zosyn -switched from PPI> pepcid -[place] heparin  Left bacterial vs. Viral conjunctivitis -con't antibiotic eye drops    Best Practice (right click and "Reselect all SmartList Selections" daily)   Diet/type: tubefeeds- on hold fo prrocedure DVT prophylaxis: prophylactic heparin  GI prophylaxis: famotidine Lines: Central line> hopefully can remove later today Foley:  N/A Code Status:  full code Last date of multidisciplinary goals of care discussion [father updated daily at bedside ]  This patient is critically ill with multiple organ system failure which requires frequent high complexity decision making, assessment, support, evaluation, and titration of therapies. This was completed through the application of advanced monitoring technologies and extensive interpretation of multiple databases. During this encounter critical care time was devoted to patient care services described in this note for 38 minutes.    Adron Bene, DO 11/14/21 1:47 PM [facility]

## 2021-11-14 NOTE — Progress Notes (Signed)
Nutrition Follow-up  DOCUMENTATION CODES:   Not applicable  INTERVENTION:   Continue tube feeding via OG tube: Increase Vital AF 1.2 to goal rate of 70 ml/h (1680 ml per day).  Provides 2016 kcal, 126 gm protein, 1362 ml free water daily.  NUTRITION DIAGNOSIS:   Inadequate oral intake related to inability to eat as evidenced by NPO status.  Ongoing   GOAL:   Patient will meet greater than or equal to 90% of their needs  Progressing with increase in TF to goal rate  MONITOR:   Vent status, TF tolerance, Labs  REASON FOR ASSESSMENT:   Ventilator, Consult Enteral/tube feeding initiation and management  ASSESSMENT:   31 yo male admitted with sepsis d/t aspiration PNA, AKI. 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  Discussed patient in ICU rounds and with RN today. Cortrak placed 8/16, tip is near the pylorus. Currently receiving TF at 30 ml/h with free water flushes 200 ml every 6 hours. TF has been on hold or at a low rate d/t ileus. Ileus has resolved. Tolerating TF without difficulty. Okay to increase to goal rate per discussion with CCM.   Patient remains intubated on ventilator support MV: 12.4 L/min Temp (24hrs), Avg:98.9 F (37.2 C), Min:98.4 F (36.9 C), Max:99.9 F (37.7 C)   Labs reviewed.  CBG: 107-120-116  Medications reviewed and include Pepcid, Banatrol.  Admission weight 71.6 kg, current weight 78.4 kg.  Intake/Output Summary (Last 24 hours) at 11/14/2021 1339 Last data filed at 11/14/2021 1127 Gross per 24 hour  Intake 1030.5 ml  Output 1350 ml  Net -319.5 ml   Net IO Since Admission: 4,173.35 mL [11/14/21 1339]   Diet Order:   Diet Order             Diet NPO time specified  Diet effective midnight                   EDUCATION NEEDS:   No education needs have been identified at this time  Skin:  Skin Assessment: Reviewed RN Assessment  Last BM:  8/20 type 6  Height:   Ht Readings from Last 1 Encounters:   11/10/21 5\' 6"  (1.676 m)    Weight:   Wt Readings from Last 1 Encounters:  11/13/21 78.4 kg    Ideal Body Weight:  64.5 kg  BMI:  Body mass index is 27.9 kg/m.  Estimated Nutritional Needs:   Kcal:  2000-2300  Protein:  100-120 gm  Fluid:  2-2.3 L   11/15/21 RD, LDN, CNSC Please refer to Amion for contact information.

## 2021-11-14 NOTE — Progress Notes (Signed)
Referring Physician(s): Dr. Caprice Red  Supervising Physician: Ruel Favors  Patient Status:  [facility] - In-pt  Chief Complaint:  LLL PNA with loculated pleural effusion s/p LLL chest tube placed on 8.20.23  Subjective:  Unable to assess due to patient's mental status ( baseline). Father at bedside.   Allergies: Latex  Medications: Prior to Admission medications   Medication Sig Start Date End Date Taking? Authorizing Provider  loratadine (CLARITIN) 10 MG tablet Take 10 mg by mouth daily.   Yes [provider]     Vital Signs: BP (!) 112/58   Pulse (!) 110   Temp 99.7 F (37.6 C) (Axillary)   Resp 18   Ht 5\' 6"  (1.676 m)   Wt 172 lb 13.5 oz (78.4 kg)   SpO2 94%   BMI 27.90 kg/m   Physical Exam Constitutional:      Appearance: He is ill-appearing.  Cardiovascular:     Rate and Rhythm: Regular rhythm.  Pulmonary:     Comments:  Intubated. LLL chest tube noted.  550 mL of amber fluid in the atrium device.  [place] noted Neurological:     Mental Status: He is alert.     Imaging: DG CHEST PORT 1 VIEW  Addendum Date: 11/14/2021   ADDENDUM REPORT: 11/14/2021 09:14 ADDENDUM: These results were called by telephone at the time of interpretation on 11/14/2021 at 9:03 am to provider DR.[email or web address] , who verbally acknowledged these results. Electronically Signed   By: 11/16/2021 M.D.   On: 11/14/2021 09:14   Result Date: 11/14/2021 CLINICAL DATA:  Chest tube follow-up. EXAM: PORTABLE CHEST 1 VIEW COMPARISON:  November 12, 2021 FINDINGS: RIGHT IJ central venous catheter terminates in the mid superior vena cava. Gastric tube and feeding tube course off the field of the radiograph into the upper abdomen. Endotracheal tube 3.7 cm from the carina. LEFT-sided chest tube in the LEFT pleural space with diminished opacity at the LEFT lung base compared to previous imaging. Interstitial and alveolar opacity slight increase since previous imaging at the RIGHT lung base. Airspace  disease in the retrocardiac region has diminished following placement of LEFT-sided chest tube. Lucency at the LEFT costodiaphragmatic sulcus following placement of the LEFT-sided chest tube which is coiled in the LEFT lower chest. a subtle lucency tracking along the lateral LEFT pleural space as well. EKG leads project over the chest. On limited assessment no acute skeletal findings. IMPRESSION: 1. Improved aeration at the LEFT lung base and improvement with respect to pleural fluid and airspace disease in the LEFT lower lobe likely associated with small to moderate LEFT-sided pneumothorax which may be ex vacuo type given appearance of the chest on previous imaging with failure of necrotic pneumonia in the LEFT lower lobe to re-expand following tube placement. Suggest attention on follow-up 2. Increasing interstitial and airspace opacities at the RIGHT lung base may reflect sequela of multifocal pneumonia or aspiration. 3. Support devices as above. A call is out to the referring provider to further discuss findings in the above case. Electronically Signed: By: November 14, 2021 M.D. On: 11/14/2021 08:13   CT CHEST W CONTRAST  Result Date: 11/12/2021 CLINICAL DATA:  Pneumonia.  Pleural effusion. EXAM: CT CHEST WITH CONTRAST TECHNIQUE: Multidetector CT imaging of the chest was performed during intravenous contrast administration. RADIATION DOSE REDUCTION: This exam was performed according to the departmental dose-optimization program which includes automated exposure control, adjustment of the mA and/or kV according to patient size and/or use of iterative reconstruction technique. CONTRAST:  48mL OMNIPAQUE IOHEXOL 300 MG/ML  SOLN COMPARISON:  10/25/2009 FINDINGS: Cardiovascular: The heart size is normal. No substantial pericardial effusion. No thoracic aortic aneurysm. No substantial atherosclerosis of the thoracic aorta. Mediastinum/Nodes: No mediastinal lymphadenopathy. NG tube and feeding tube noted in the  esophagus. There is no hilar lymphadenopathy. There is no axillary lymphadenopathy. Lungs/Pleura: Septal thickening with patchy nodular opacity in the left apex is associated with more confluent airspace disease in the lingula with areas of lung parenchymal cavitation within the consolidated left upper lobe. There is associated left lower lobe collapse/consolidation. Multiple ill-defined nodular opacities are seen in the right upper and lower lobes with volume loss and consolidation in the right middle lobe. Moderate left and small right pleural effusions evident. Upper Abdomen: NG tube tip is in the gastric fundus. Feeding tube tip is just distal to the ligament of Treitz. Gallbladder is nondistended with appearance of gallbladder wall thickening. Musculoskeletal: No worrisome lytic or sclerotic osseous abnormality. IMPRESSION: 1. Bilateral ill-defined patchy and nodular airspace disease suggesting multifocal pneumonia. 2. Consolidative disease in the lingula shows associated cavitation, consistent with necrotic pneumonia. 3. Moderate left and small right pleural effusions. 4. Gallbladder wall thickening although gallbladder is underdistended. No gallstones evident by CT imaging. Acalculous cholecystitis could have this appearance and gallbladder wall thickness is likely accentuated by underdistention. 5. Feeding tube tip is just distal to the ligament of Treitz. NG tube tip is in the stomach. Electronically Signed   By: Kennith Center M.D.   On: 11/12/2021 12:35   DG CHEST PORT 1 VIEW  Result Date: 11/12/2021 CLINICAL DATA:  Respirator dependent. EXAM: PORTABLE CHEST 1 VIEW COMPARISON:  11/10/2021 FINDINGS: Right IJ catheter tip projects over the SVC. ET tube tip is above the carina. There is a feeding tube and NG tube with tips well below the hemidiaphragms. There is dense asymmetric opacification within the left midlung and left lower lung which appears similar to the previous exam. Patchy opacities within  the lateral right lung base are stable. IMPRESSION: 1. No significant change in aeration to the left midlung and left base. 2. Stable support apparatus. Electronically Signed   By: Signa Kell M.D.   On: 11/12/2021 08:23    Labs:  CBC: Recent Labs    11/11/21 0251 11/12/21 0436 11/13/21 0455 11/14/21 1001  WBC 7.7 7.3 7.8 7.3  HGB 10.1* 9.6* 9.5* 9.2*  HCT 30.6* 29.8* 28.3* 28.2*  PLT 94* 120* 159 234    COAGS: Recent Labs    11/17/2021 1613 11/09/21 0742  INR 1.9* 1.3*  APTT 40* 59*    BMP: Recent Labs    11/10/21 0335 11/11/21 0251 11/12/21 0436 11/14/21 1001  NA 148* 142 143 143  K 3.5 3.8 3.8 3.5  CL 116* 112* 105 110  CO2 25 24 24 25   GLUCOSE 89 100* 146* 111*  BUN 20 12 9 10   CALCIUM 7.4* 7.2* 6.9* 8.0*  CREATININE 0.90 0.72 0.85 0.65  GFRNONAA >60 >60 >60 >60    LIVER FUNCTION TESTS: Recent Labs    11/13/2021 1534 11/07/21 0519 11/10/21 0335  BILITOT 1.3* 1.3* 1.5*  AST 84* 67* 50*  ALT 45* 39 32  ALKPHOS 85 65 131*  PROT 6.7 4.9* 4.5*  ALBUMIN 3.2* 2.0* 1.7*    Assessment and Plan:  31 y.o. male history of autism. Non verbal. Presented to the ED at [facility] with "green spillage" from mouth. No vomiting with progressive weakness.  Found to be in septic shock, AKI, LLL PNA.  Patient was transferred to [facility] for further evaluation and treatment.  Patient is currently intubated.  But alert.  Father and other family member at bedside.  IR placed a left sided chest tube on November 13, 2021.    Cultures from chest tube show no growth in 24 hours. Bronchoscopy cultures positive for E. coli.  Patient currently on Zosyn 550 mL of amber fluid noted to be in the atrium device.   IR will continue to follow along - plans per [facility].   Electronically Signed: Jacqualine Mau, NP 11/14/2021, 3:35 PM   I spent a total of 15 Minutes at the patient's bedside AND on the patient's hospital floor or unit, greater than 50% of which was counseling/coordinating care  fors/p LLL chest tube placement on 8.20.23

## 2021-11-14 NOTE — Progress Notes (Signed)
Pharmacy Electrolyte Replacement  Recent Labs:  Recent Labs    11/13/21 0455 11/14/21 1001  K  --  3.5  MG 1.9  --   PHOS 3.5  --   CREATININE  --  0.65    Low Critical Values (K </= 2.5, Phos </= 1, Mg </= 1) Present: None  MD Contacted: n/a Receiving Lasix 20mg  IV x1 today.   Plan:  Potassium chloride 40 mEq per tube x 1 per protocol Magnesium 2g IV x1 per protocol Recheck labs in AM  , PharmD, BCPS, BCCCP Clinical Pharmacist Please refer to [facility] for [facility] Pharmacy numbers 11/14/2021, 4:09 PM

## 2021-11-15 DIAGNOSIS — J9601 Acute respiratory failure with hypoxia: Secondary | ICD-10-CM | POA: Diagnosis not present

## 2021-11-15 DIAGNOSIS — A419 Sepsis, unspecified organism: Secondary | ICD-10-CM | POA: Diagnosis not present

## 2021-11-15 LAB — BASIC METABOLIC PANEL
Anion gap: 7 (ref 5–15)
BUN: 14 mg/dL (ref 6–20)
CO2: 26 mmol/L (ref 22–32)
Calcium: 7.9 mg/dL — ABNORMAL LOW (ref 8.9–10.3)
Chloride: 112 mmol/L — ABNORMAL HIGH (ref 98–111)
Creatinine, Ser: 0.74 mg/dL (ref 0.61–1.24)
GFR, Estimated: >60 mL/min (ref >60)
Glucose, Bld: 121 mg/dL — ABNORMAL HIGH (ref 70–99)
Potassium: 3.8 mmol/L (ref 3.5–5.1)
Sodium: 145 mmol/L (ref 135–145)

## 2021-11-15 LAB — GLUCOSE, CAPILLARY
Glucose-Capillary: 103 mg/dL — ABNORMAL HIGH (ref 70–99)
Glucose-Capillary: 107 mg/dL — ABNORMAL HIGH (ref 70–99)
Glucose-Capillary: 94 mg/dL (ref 70–99)
Glucose-Capillary: 103 mg/dL — ABNORMAL HIGH (ref 70–99)
Glucose-Capillary: 101 mg/dL — ABNORMAL HIGH (ref 70–99)

## 2021-11-15 LAB — CBC
HCT: 28.2 % — ABNORMAL LOW (ref 39.0–52.0)
Hemoglobin: 9.1 g/dL — ABNORMAL LOW (ref 13.0–17.0)
MCH: 31.2 pg (ref 26.0–34.0)
MCHC: 32.3 g/dL (ref 30.0–36.0)
MCV: 96.6 fL (ref 80.0–100.0)
Platelets: 308 10*3/uL (ref 150–400)
RBC: 2.92 MIL/uL — ABNORMAL LOW (ref 4.22–5.81)
RDW: 13.7 % (ref 11.5–15.5)
WBC: 8.5 10*3/uL (ref 4.0–10.5)
nRBC: 0 % (ref 0.0–0.2)

## 2021-11-15 LAB — MAGNESIUM: Magnesium: 2.6 mg/dL — ABNORMAL HIGH (ref 1.7–2.4)

## 2021-11-15 LAB — PHOSPHORUS: Phosphorus: 2.9 mg/dL (ref 2.5–4.6)

## 2021-11-15 MED ORDER — SODIUM CHLORIDE 0.9 % IV SOLN: INTRAVENOUS | Status: DC | PRN

## 2021-11-15 MED ORDER — IPRATROPIUM-ALBUTEROL 0.5-2.5 (3) MG/3ML IN SOLN
3.0000 mL | Freq: Four times a day (QID) | RESPIRATORY_TRACT | Status: DC
  Administered 2021-11-15 – 2021-11-24 (×38): 3 mL via RESPIRATORY_TRACT
  Filled 2021-11-15 (×38): qty 3

## 2021-11-15 NOTE — Progress Notes (Signed)
NAME:  Todd Snyder, MRN:  [id number], DOB:  1990-08-06, LOS: 9 ADMISSION DATE:  11/23/21,  CHIEF COMPLAINT:  Dyspnea, weakness  History of Present Illness:  60M with a hx of autism. Was well until 2 days prior to admission when he was noted to have nausea, vomiting, poor PO intake. Experienced progressive weakness, tachypnea, tachycardia. EMS was call and he was found in respiratory distress.  In ED required intubation and MV. Eval revealed LLL PNA, acute renal failure. Also question ileus vs bowel obstruction. Transfers to [facility] with respiratory failure, septic shock and acute renal failure.   Pertinent  Medical History   Past Medical History:  Diagnosis Date   Autism    Bilateral club feet     Significant Hospital Events: Including procedures, antibiotic start and stop dates in addition to other pertinent events   8/13 admitted, intubated, bronched.  8/14 clinically no ileus or SBO, no NGT output, so TF started 8/19 CXR not better despite CPT & nebs. CT shows posterior predominant loculated effusion. Still not amenable to bedside placement due to placement of effusion. IR consulted for CT-guided chest tube.  Interim History / Subjective:  Overnight no acute events.   Objective   Blood pressure 119/61, pulse 85, temperature 99.2 F (37.3 C), temperature source Axillary, resp. rate 18, height 5\' 6"  (1.676 m), weight 74.9 kg, SpO2 97 %.    Vent Mode: PRVC FiO2 (%):  [30 %] 30 % Set Rate:  [16 bmp-185 bmp] 18 bmp Vt Set:  [510 mL] 510 mL PEEP:  [5 cmH20] 5 cmH20 Pressure Support:  [18 cmH20] (P) 18 cmH20 Plateau Pressure:  [18 cmH20-21 cmH20] 19 cmH20   Intake/Output Summary (Last 24 hours) at 11/15/2021 1343 Last data filed at 11/15/2021 1152 Gross per 24 hour  Intake 1952.5 ml  Output 1905 ml  Net 47.5 ml   Filed Weights   11/12/21 0500 11/13/21 0500 11/15/21 0700  Weight: 79.2 kg 78.4 kg 74.9 kg    Examination: General:  critically ill appearing man lying in bed  in NAD HEENT: [place]/AT, eyes anicteric, oral mucosa moist. ETT in place. Neuro: alert, looking around the room, not moving much but moves when stimulated. CV: S1S2, RRR PULM:  synchronous with MV, CTAB anteriorly, chest tube posteriorly with 500cc drainage output GI: soft, NT, TF on hold for procedure Extremities: mild pedal edema, no cyanosis Skin: no rashes, skin warm & dry  Resolved Hospital Problem list   Transaminase elevation AKI Hypernatremia Hypokalemia Hypophosphatemia thrombocytopenia  Assessment & Plan:   Acute respiratory failure due to LLL E Coli Pneumonia, complicated by loculated pleural effusion posteriorly and pneumothorax S/p chest tube for necrotic portion of lung. Pneumothorax noted LLL.  Patient taking rapid shallow breaths when support is removed. Lungs are quite weak and have not fully recovered from infection at this time. Although on low vent settings, he is still requiring pressure support and becomes tachypneic during attempts to wean. Unable to wean at this time. -LTVV -VAP prevention protocol -PAD protocol for sedation -daily SAT & SBT as appropriate;, will likely attempt extubation tomorrow if respiratory status unchanged -con't zosyn; anticipate he will need a prolonged course 14 days - chest tube maintenance - CXR tomorrow - Bronchodilators  Sepsis; shock resolved Chest tube placed 8/20 with 500cc output.  --zosyn; need for prolonged course -Follow repeat trach aspirate cultures from 8/17.  Gastroenteritis, present before admission.  No recent vomiting or diarrhea -banatrol on hold, can resume if diarrhea returns - resume  daily miralax -monitor for TF intolerance  AKI resolved -con't to monitor -strict I/Os  Supraventricular arrhythmia with tachycardia 2/2 sepsis-resolved -tele monitoring  Acute metabolic encephalopathy due to sepsis Hx of ataxic cerebral palsy and autism:  Baseline nonverbal; communicates with hand signals. Can mostly  feed himself (spoon more easily than fork), can dress himself with minimal assistance.  -minimize sedation as able -Anticipate a prolonged recovery  At risk for malnutrition -TF can resume after chest tube placement - Continue postpyloric feeding via core track at full rate  Thrombocytopenia, likely 2/2 sepsis vs antibitoics> resolved Anemia due to critical illness 4T score low probability & HIT Ab negative - Zosyn -switched from PPI> pepcid -[place] heparin  Left bacterial vs. Viral conjunctivitis -con't antibiotic eye drops    Best Practice (right click and "Reselect all SmartList Selections" daily)   Diet/type: tubefeeds- on hold fo prrocedure DVT prophylaxis: prophylactic heparin  GI prophylaxis: famotidine Lines: Central line> hopefully can remove later today Foley:  N/A Code Status:  full code Last date of multidisciplinary goals of care discussion [father updated daily at bedside ]

## 2021-11-16 ENCOUNTER — Inpatient Hospital Stay (HOSPITAL_COMMUNITY): Payer: Medicare Other

## 2021-11-16 DIAGNOSIS — J9601 Acute respiratory failure with hypoxia: Secondary | ICD-10-CM | POA: Diagnosis not present

## 2021-11-16 DIAGNOSIS — A419 Sepsis, unspecified organism: Secondary | ICD-10-CM | POA: Diagnosis not present

## 2021-11-16 LAB — GLUCOSE, CAPILLARY
Glucose-Capillary: 103 mg/dL — ABNORMAL HIGH (ref 70–99)
Glucose-Capillary: 106 mg/dL — ABNORMAL HIGH (ref 70–99)
Glucose-Capillary: 110 mg/dL — ABNORMAL HIGH (ref 70–99)
Glucose-Capillary: 123 mg/dL — ABNORMAL HIGH (ref 70–99)
Glucose-Capillary: 124 mg/dL — ABNORMAL HIGH (ref 70–99)

## 2021-11-16 LAB — BASIC METABOLIC PANEL
Anion gap: 6 (ref 5–15)
BUN: 13 mg/dL (ref 6–20)
CO2: 23 mmol/L (ref 22–32)
Calcium: 7.8 mg/dL — ABNORMAL LOW (ref 8.9–10.3)
Chloride: 114 mmol/L — ABNORMAL HIGH (ref 98–111)
Creatinine, Ser: 0.55 mg/dL — ABNORMAL LOW (ref 0.61–1.24)
GFR, Estimated: >60 mL/min (ref >60)
Glucose, Bld: 107 mg/dL — ABNORMAL HIGH (ref 70–99)
Potassium: 3.8 mmol/L (ref 3.5–5.1)
Sodium: 143 mmol/L (ref 135–145)

## 2021-11-16 LAB — CULTURE, RESPIRATORY W GRAM STAIN

## 2021-11-16 LAB — CBC
HCT: 28 % — ABNORMAL LOW (ref 39.0–52.0)
Hemoglobin: 8.9 g/dL — ABNORMAL LOW (ref 13.0–17.0)
MCH: 30.7 pg (ref 26.0–34.0)
MCHC: 31.8 g/dL (ref 30.0–36.0)
MCV: 96.6 fL (ref 80.0–100.0)
Platelets: 330 10*3/uL (ref 150–400)
RBC: 2.9 MIL/uL — ABNORMAL LOW (ref 4.22–5.81)
RDW: 13.6 % (ref 11.5–15.5)
WBC: 8.5 10*3/uL (ref 4.0–10.5)
nRBC: 0 % (ref 0.0–0.2)

## 2021-11-16 LAB — PHOSPHORUS: Phosphorus: 3 mg/dL (ref 2.5–4.6)

## 2021-11-16 LAB — MAGNESIUM: Magnesium: 2 mg/dL (ref 1.7–2.4)

## 2021-11-16 MED ORDER — SODIUM BICARBONATE 650 MG PO TABS
650.0000 mg | ORAL_TABLET | Freq: Once | ORAL | Status: AC
  Administered 2021-11-16: 650 mg
  Filled 2021-11-16: qty 1

## 2021-11-16 MED ORDER — DEXAMETHASONE SODIUM PHOSPHATE 10 MG/ML IJ SOLN
8.0000 mg | Freq: Two times a day (BID) | INTRAMUSCULAR | Status: DC
Start: 2021-11-16 — End: 2021-11-18
  Administered 2021-11-16 – 2021-11-17 (×3): 8 mg via INTRAVENOUS
  Filled 2021-11-16 (×4): qty 0.8

## 2021-11-16 MED ORDER — PANCRELIPASE (LIP-PROT-AMYL) 10440-39150 UNITS PO TABS
20880.0000 [IU] | ORAL_TABLET | Freq: Once | ORAL | Status: AC
  Administered 2021-11-16: 20880 [IU]
  Filled 2021-11-16: qty 2

## 2021-11-16 NOTE — Progress Notes (Signed)
NAME:  Todd Snyder, MRN:  [id number], DOB:  10-25-1990, LOS: 10 ADMISSION DATE:  2021-11-24,  CHIEF COMPLAINT:  Dyspnea, weakness  History of Present Illness:  46M with a hx of autism. Was well until 2 days prior to admission when he was noted to have nausea, vomiting, poor PO intake. Experienced progressive weakness, tachypnea, tachycardia. EMS was call and he was found in respiratory distress.  In ED required intubation and MV. Eval revealed LLL PNA, acute renal failure. Also question ileus vs bowel obstruction. Transfers to [facility] with respiratory failure, septic shock and acute renal failure.   Pertinent  Medical History   Past Medical History:  Diagnosis Date   Autism    Bilateral club feet     Significant Hospital Events: Including procedures, antibiotic start and stop dates in addition to other pertinent events   8/13 admitted, intubated, bronched.  8/14 clinically no ileus or SBO, no NGT output, so TF started 8/19 CXR not better despite CPT & nebs. CT shows posterior predominant loculated effusion. Still not amenable to bedside placement due to placement of effusion. IR consulted for CT-guided chest tube.  Interim History / Subjective:  Overnight no acute events.   Objective   Blood pressure (!) 108/42, pulse (!) 107, temperature 99.1 F (37.3 C), temperature source Axillary, resp. rate 20, height 5\' 6"  (1.676 m), weight 74.6 kg, SpO2 100 %.    Vent Mode: PSV;CPAP FiO2 (%):  [30 %] 30 % Set Rate:  [18 bmp] 18 bmp Vt Set:  [510 mL] 510 mL PEEP:  [5 cmH20] 5 cmH20 Pressure Support:  [12 cmH20] 12 cmH20 Plateau Pressure:  [16 cmH20-20 cmH20] 19 cmH20   Intake/Output Summary (Last 24 hours) at 11/16/2021 1127 Last data filed at 11/16/2021 0900 Gross per 24 hour  Intake 4492.46 ml  Output 395 ml  Net 4097.46 ml    Filed Weights   11/13/21 0500 11/15/21 0700 11/16/21 0448  Weight: 78.4 kg 74.9 kg 74.6 kg    Examination: General:  critically ill appearing man lying  in bed in NAD HEENT: [place]/AT, eyes anicteric, oral mucosa moist. ETT in place. Neuro: alert, looking around the room, not moving much but moves when stimulated. CV: S1S2, RRR PULM:  synchronous with MV, CTAB anteriorly, chest tube posteriorly with 500cc drainage output GI: soft, NT, TF on hold for procedure Extremities: mild pedal edema, no cyanosis Skin: no rashes, skin warm & dry  Resolved Hospital Problem list   Transaminase elevation AKI Hypernatremia Hypokalemia Hypophosphatemia thrombocytopenia  Assessment & Plan:   Acute respiratory failure due to LLL E Coli Pneumonia, complicated by loculated pleural effusion posteriorly and pneumothorax S/p chest tube for necrotic portion of lung. Pneumothorax noted LLL.  Patient successfully extubated this morning. He has been tachypneic to low 30's and taking shallow breaths, satting 100% on 6L [place], and appears comfortable otherwise. -con't zosyn; anticipate he will need a prolonged course 14 days - chest tube maintenance placed to water seal - [place] to maintain adequate sats - ensure adequate suction of oral secretions - will need soft restraints despite extubation. Per father, patient has tendency to pull at lines even at cognitive baseline.  - Bronchodilators - CPT - abx  Sepsis; shock resolved Chest tube placed 8/20 --zosyn; need for prolonged course total 14 days  Gastroenteritis,  present before admission.  No recent vomiting or diarrhea - banatrol  - resume daily miralax - monitor for TF intolerance  AKI resolved - con't to monitor - strict I/Os  Supraventricular arrhythmia with tachycardia 2/2 sepsis-resolved - tele monitoring  Acute metabolic encephalopathy due to sepsis Hx of ataxic cerebral palsy and autism:  Baseline nonverbal; communicates with hand signals. Can mostly feed himself (spoon more easily than fork), can dress himself with minimal assistance.  -minimize sedation as able -Anticipate a prolonged  recovery  At risk for malnutrition - Continue postpyloric feeding via core track at full rate  Thrombocytopenia, likely 2/2 sepsis vs antibitoics> resolved Anemia due to critical illness 4T score low probability & HIT Ab negative  Left bacterial vs. Viral conjunctivitis -con't antibiotic eye drops   Best Practice (right click and "Reselect all SmartList Selections" daily)   Diet/type: tubefeeds- DVT prophylaxis: prophylactic heparin  GI prophylaxis: famotidine Lines: Central line Foley:  N/A Code Status:  full code Last date of multidisciplinary goals of care discussion [father updated daily at bedside ]

## 2021-11-16 NOTE — Procedures (Signed)
Extubation Procedure Note  Patient Details:   Name: Todd Snyder DOB: 09-20-1990 MRN: [id number]   Airway Documentation:    Vent end date: 11/16/21 Vent end time: 1049   Evaluation  O2 sats: currently acceptable Complications: No apparent complications Patient did tolerate procedure well. Bilateral Breath Sounds: Clear, Diminished   No, he is non-verbal.   Melanee Spry 11/16/2021, 11:00 AM

## 2021-11-16 NOTE — Progress Notes (Signed)
Cortrak Tube Team Note:  Consult received due to cortrak tube clogged.  Unclogged cortrak tube, currently flushes well.  Notified RN.   If the tube becomes dislodged please keep the tube and contact the Cortrak team at [email or web address] (password [email or web address]) for replacement.  If after hours and replacement cannot be delayed, place a NG tube and confirm placement with an abdominal x-ray.    Cammy Copa., RD, LDN, CNSC See AMiON for contact information

## 2021-11-17 ENCOUNTER — Inpatient Hospital Stay (HOSPITAL_COMMUNITY): Payer: Medicare Other

## 2021-11-17 DIAGNOSIS — A419 Sepsis, unspecified organism: Secondary | ICD-10-CM | POA: Diagnosis not present

## 2021-11-17 DIAGNOSIS — J9601 Acute respiratory failure with hypoxia: Secondary | ICD-10-CM | POA: Diagnosis not present

## 2021-11-17 LAB — CBC
HCT: 31.8 % — ABNORMAL LOW (ref 39.0–52.0)
Hemoglobin: 10.1 g/dL — ABNORMAL LOW (ref 13.0–17.0)
MCH: 30.7 pg (ref 26.0–34.0)
MCHC: 31.8 g/dL (ref 30.0–36.0)
MCV: 96.7 fL (ref 80.0–100.0)
Platelets: 434 10*3/uL — ABNORMAL HIGH (ref 150–400)
RBC: 3.29 MIL/uL — ABNORMAL LOW (ref 4.22–5.81)
RDW: 13.4 % (ref 11.5–15.5)
WBC: 11 10*3/uL — ABNORMAL HIGH (ref 4.0–10.5)
nRBC: 0 % (ref 0.0–0.2)

## 2021-11-17 LAB — GLUCOSE, CAPILLARY
Glucose-Capillary: 144 mg/dL — ABNORMAL HIGH (ref 70–99)
Glucose-Capillary: 149 mg/dL — ABNORMAL HIGH (ref 70–99)
Glucose-Capillary: 137 mg/dL — ABNORMAL HIGH (ref 70–99)
Glucose-Capillary: 137 mg/dL — ABNORMAL HIGH (ref 70–99)
Glucose-Capillary: 114 mg/dL — ABNORMAL HIGH (ref 70–99)

## 2021-11-17 LAB — BASIC METABOLIC PANEL
Anion gap: 5 (ref 5–15)
BUN: 13 mg/dL (ref 6–20)
CO2: 25 mmol/L (ref 22–32)
Calcium: 8 mg/dL — ABNORMAL LOW (ref 8.9–10.3)
Chloride: 117 mmol/L — ABNORMAL HIGH (ref 98–111)
Creatinine, Ser: 0.47 mg/dL — ABNORMAL LOW (ref 0.61–1.24)
GFR, Estimated: >60 mL/min (ref >60)
Glucose, Bld: 119 mg/dL — ABNORMAL HIGH (ref 70–99)
Potassium: 3.5 mmol/L (ref 3.5–5.1)
Sodium: 147 mmol/L — ABNORMAL HIGH (ref 135–145)

## 2021-11-17 LAB — MAGNESIUM: Magnesium: 2 mg/dL (ref 1.7–2.4)

## 2021-11-17 LAB — PHOSPHORUS: Phosphorus: 2.6 mg/dL (ref 2.5–4.6)

## 2021-11-17 MED ORDER — ORAL CARE MOUTH RINSE
15.0000 mL | OROMUCOSAL | Status: DC
  Administered 2021-11-17 (×4): 15 mL via OROMUCOSAL

## 2021-11-17 MED ORDER — SODIUM CHLORIDE 3 % IN NEBU
4.0000 mL | INHALATION_SOLUTION | Freq: Four times a day (QID) | RESPIRATORY_TRACT | Status: AC
  Administered 2021-11-17 (×2): 4 mL via RESPIRATORY_TRACT
  Filled 2021-11-17 (×4): qty 4

## 2021-11-17 MED ORDER — ORAL CARE MOUTH RINSE: 15.0000 mL | OROMUCOSAL | Status: DC | PRN

## 2021-11-17 MED ORDER — POTASSIUM CHLORIDE 20 MEQ PO PACK
40.0000 meq | PACK | Freq: Once | ORAL | Status: AC
  Administered 2021-11-17: 40 meq
  Filled 2021-11-17: qty 2

## 2021-11-17 MED ORDER — SODIUM CHLORIDE 0.9 % IV SOLN
1.0000 g | INTRAVENOUS | Status: AC
  Administered 2021-11-17 – 2021-11-23 (×7): 1 g via INTRAVENOUS
  Filled 2021-11-17 (×8): qty 10

## 2021-11-17 MED ORDER — WHITE PETROLATUM EX OINT
TOPICAL_OINTMENT | CUTANEOUS | Status: DC | PRN
  Filled 2021-11-17: qty 28.35

## 2021-11-17 MED ORDER — GERHARDT'S BUTT CREAM
1.0000 | TOPICAL_CREAM | Freq: Two times a day (BID) | CUTANEOUS | Status: DC
  Administered 2021-11-17 – 2021-11-29 (×25): 1 via TOPICAL
  Filled 2021-11-17 (×3): qty 1

## 2021-11-17 MED ORDER — FREE WATER
200.0000 mL | Status: DC
  Administered 2021-11-17 – 2021-11-18 (×6): 200 mL

## 2021-11-17 NOTE — Progress Notes (Addendum)
NAME:  Todd Snyder, MRN:  [id number], DOB:  20-Aug-1990, LOS: 11 ADMISSION DATE:  11/11/2021,  CHIEF COMPLAINT:  Dyspnea, weakness  History of Present Illness:  1M with a hx of autism. Was well until 2 days prior to admission when he was noted to have nausea, vomiting, poor PO intake. Experienced progressive weakness, tachypnea, tachycardia. EMS was call and he was found in respiratory distress.  In ED required intubation and MV. Eval revealed LLL PNA, acute renal failure. Also question ileus vs bowel obstruction. Transfers to [facility] with respiratory failure, septic shock and acute renal failure.   Pertinent  Medical History   Past Medical History:  Diagnosis Date   Autism    Bilateral club feet     Significant Hospital Events: Including procedures, antibiotic start and stop dates in addition to other pertinent events   8/13 admitted, intubated, bronched.  8/14 clinically no ileus or SBO, no NGT output, so TF started 8/19 CXR not better despite CPT & nebs. CT shows posterior predominant loculated effusion. Still not amenable to bedside placement due to placement of effusion. IR consulted for CT-guided chest tube. 8/23 extubated, continues to have secretions  Interim History / Subjective:  Tachycardiac to 110s with RR in upper 20s. Lots of secretions requiring suctioning. 3LNC  Subjective: Tracts speaker, does not follow commands.  Objective   Blood pressure 134/70, pulse (!) 115, temperature 99.1 F (37.3 C), temperature source Axillary, resp. rate (!) 32, height 5\' 6"  (1.676 m), weight 71.2 kg, SpO2 99 %.        Intake/Output Summary (Last 24 hours) at 11/17/2021 0902 Last data filed at 11/17/2021 0617 Gross per 24 hour  Intake 1508.28 ml  Output 1705 ml  Net -196.72 ml   Filed Weights   11/15/21 0700 11/16/21 0448 11/17/21 0500  Weight: 74.9 kg 74.6 kg 71.2 kg    Examination: General:  chronically ill appearing man lying in bed in NAD HEENT: [place]/AT, eyes anicteric,  oral mucosa moist. [place] in place Neuro: alert, looking around the room, not moving much but moves when stimulated. CV: S1S2, RRR PULM:  decreased air sounds in left lower lobe, chest tube posteriorly with 50cc drainage output GI: soft, NT, TF at goal Extremities: mild pedal edema, no cyanosis Skin: no rashes, skin warm & dry  Labs/ imaging: Na 143-> 147 K 3.5 Creatinine 0.47 BUN 13 WBC 8.5>11 Hgb 8.9> 10.1  I  1.6 O 1.7 N -657cc  Pleural fluid culture- no growth for 4 days  Resolved Hospital Problem list   Transaminase elevation AKI Hypernatremia Hypokalemia Hypophosphatemia Thrombocytopenia Septic shock AKI Supraventricular arrhythmia with tachycardia 2/2 sepsis  Assessment & Plan:  LLL E Coli Pneumonia, complicated by loculated pleural effusion posteriorly and pneumothorax S/p chest tube for necrotic portion of lung, minimal. Pneumothorax noted LLL. Minimal output from chest tube since yesterday. Bedside ultrasounds completed and shows resolution of fluid collection. Patient successfully extubated 8/23. Supplemental oxygen is weaning to 3L [place] this AM. He continues to have increased secretions needing multiple suctions hourly. Staying in ICU today with frequent suctions needs. Hopefully will be able to transition to the floor 8/25. -deescalate from zosyn to ceftriaxone; prolonged course of 14 days last doses 8/30 - chest tube maintenance placed to water seal - [place] to maintain adequate sats - ensure adequate suction of oral secretions - will need soft restraints despite extubation. Per father, patient has tendency to pull at lines even at cognitive baseline.  - Bronchodilators - CPT - Decadron  8 mg q 12 hrs for 2 days, completing doses 8/24  Hypernatremia Na at 147 this AM. -increasing free water to 200cc q4 hrs -trend BMP  Gastroenteritis,  present before admission.  Continues to have diarrhea. FMS in place. - banatrol  - resume daily miralax - monitor for TF  intolerance  Acute metabolic encephalopathy due to sepsis- improving Hx of ataxic cerebral palsy and autism:  Baseline nonverbal; communicates with hand signals. Can mostly feed himself (spoon more easily than fork), can dress himself with minimal assistance. Need speech evaluation. -speech evaluation -minimize sedation as able -Anticipate a prolonged recovery  At risk for malnutrition - Continue postpyloric feeding via core track at full rate  Left bacterial vs. Viral conjunctivitis -con't antibiotic eye drops   Best Practice (right click and "Reselect all SmartList Selections" daily)   Diet/type: tubefeeds DVT prophylaxis: prophylactic heparin  GI prophylaxis: famotidine Lines: N/A Foley:  N/A Code Status:  full code Last date of multidisciplinary goals of care discussion [father updated daily at bedside]

## 2021-11-17 NOTE — Evaluation (Signed)
Clinical/Bedside Swallow Evaluation Patient Details  Name: Todd Snyder MRN: [id number] Date of Birth: [birth year]  Today's Date: 11/17/2021 Time: SLP Start Time (ACUTE ONLY): 1400 SLP Stop Time (ACUTE ONLY): 1432 SLP Time Calculation (min) (ACUTE ONLY): 32 min  Past Medical History:  Past Medical History:  Diagnosis Date   Autism    Bilateral club feet    Past Surgical History:   HPI:  31 year old man who remains critically ill due to acute hypoxic respiratory failure secondary to likely aspiration pneumonia after suspected aspiration of emesis. Prolonged intubation (8/13-8/23). 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 PMH includes aspiration PNA.    Assessment / Plan / Recommendation  Clinical Impression   Pt alert and followed some simple commands. Pt has fixed open mouth posture and limited tongue ROM, secondary to CP diagnosis. Pt assisted in performing oral care. Pt administered ice chips, thin liquids and puree consistency. Immediate and delayed weak cough, multiple swallow attempts, and audible swallow were observed in all trials. Pt has a significant, wet congested baseline cough. However, he coughed directly after PO trials indicating potential aspiration. Father reported prior to admission pt consumed regular texture/thin liquids and denied difficulty. Family informed and agrees with treatment plan. ST f/u to recommend instrumental when appropriate. Pt continue NPO with oral care.  SLP Visit Diagnosis: Dysphagia, unspecified (R13.10)    Aspiration Risk  Severe aspiration risk    Diet Recommendation NPO   Medication Administration: Via alternative means    Other  Recommendations Oral Care Recommendations: Oral care QID Other Recommendations: Have oral suction available    Recommendations for follow up therapy are one component of a multi-disciplinary discharge planning process, led by the attending physician.  Recommendations may be updated based on  patient status, additional functional criteria and insurance authorization.  Follow up Recommendations  (TBD)      Assistance Recommended at Discharge Frequent or constant Supervision/Assistance  Functional Status Assessment Patient has had a recent decline in their functional status and demonstrates the ability to make significant improvements in function in a reasonable and predictable amount of time.  Frequency and Duration min 2x/week  2 weeks       Prognosis Prognosis for Safe Diet Advancement: Good Barriers to Reach Goals: Cognitive deficits;Behavior      Swallow Study   General Date of Onset: 11/04/2021 HPI: 31 year old man who remains critically ill due to acute hypoxic respiratory failure secondary to likely aspiration pneumonia after suspected aspiration of emesis. Prolonged intubation (8/13-8/23). 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 PMH includes aspiration PNA. Type of Study: Bedside Swallow Evaluation Diet Prior to this Study: NPO Temperature Spikes Noted: No Respiratory Status: Nasal cannula History of Recent Intubation: Yes Length of Intubations (days): 10 days Date extubated: 11/16/21 Behavior/Cognition: Cooperative;Alert;Requires cueing Oral Cavity Assessment: Excessive secretions;Dried secretions Oral Care Completed by SLP: Yes Oral Cavity - Dentition: Adequate natural dentition Self-Feeding Abilities: Needs assist Patient Positioning: Upright in bed Baseline Vocal Quality: Not observed (pt non verbal) Volitional Cough: Other (Comment) (pt unable. with congested respirations and reflexive cough) Volitional Swallow: Unable to elicit    Oral/Motor/Sensory Function Overall Oral Motor/Sensory Function:  (Pt open mouth posture. See impressions.)   Ice Chips Ice chips: Impaired Presentation: Spoon Oral Phase Impairments: Reduced labial seal Pharyngeal Phase Impairments: Cough - Immediate;Cough - Delayed;Decreased hyoid-laryngeal  movement;Multiple swallows   Thin Liquid Thin Liquid: Impaired Presentation: Cup Oral Phase Impairments: Reduced labial seal Pharyngeal  Phase Impairments: Cough - Immediate;Cough - Delayed  Nectar Thick Nectar Thick Liquid: Not tested   Honey Thick Honey Thick Liquid: Not tested   Puree Puree: Impaired Presentation: Spoon Oral Phase Impairments: Reduced labial seal Oral Phase Functional Implications: Oral residue (trace) Pharyngeal Phase Impairments: Throat Clearing - Delayed;Multiple swallows   Solid     Solid: Not tested      Gearlean Alf Student SLP   11/17/2021,3:59 PM

## 2021-11-17 NOTE — Progress Notes (Signed)
[facility] ADULT ICU REPLACEMENT PROTOCOL   The patient does apply for the [facility] Adult ICU Electrolyte Replacment Protocol based on the criteria listed below:   1.Exclusion criteria: TCTS patients, ECMO patients, and Dialysis patients 2. Is GFR >/= 30 ml/min? Yes.    Patient's GFR today is >60 3. Is SCr </= 2? Yes.   Patient's SCr is 0.47 mg/dL 4. Did SCr increase >/= 0.5 in 24 hours? No. 5.Pt's weight >40kg  Yes.   6. Abnormal electrolyte(s): K+ 3.5  7. Electrolytes replaced per protocol 8.  Call MD STAT for K+ </= 2.5, Phos </= 1, or Mag </= 1 Physician:  n/a  Melvern Banker 11/17/2021 1:50 AM

## 2021-11-17 NOTE — Evaluation (Signed)
Physical Therapy Evaluation Patient Details Name: Todd Snyder MRN: [id number] DOB: 01-Nov-1990 Today's Date: 11/17/2021  History of Present Illness  pt is a 31 y/o male presenting as a transfer from [facility] with septic shock due to LLL PNA.  PMHx: Autism and Bilateral club feet  Clinical Impression  Pt admitted with/for septic shock.  Pt is not at baseline as he is usually ambulatory, now needing min to mod assist for basic mobility.  Pt currently limited functionally due to the problems listed below.  (see problems list.)  Pt will benefit from PT to maximize function and safety to be able to get home safely with available assist.       Recommendations for follow up therapy are one component of a multi-disciplinary discharge planning process, led by the attending physician.  Recommendations may be updated based on patient status, additional functional criteria and insurance authorization.  Follow Up Recommendations Home health PT (based on progress)      Assistance Recommended at Discharge Frequent or constant Supervision/Assistance  Patient can return home with the following  A little help with walking and/or transfers;A little help with bathing/dressing/bathroom;Assistance with cooking/housework;Direct supervision/assist for medications management;Direct supervision/assist for financial management;Assist for transportation;Help with stairs or ramp for entrance    Equipment Recommendations Other (comment) (TBA)  Recommendations for Other Services       Functional Status Assessment Patient has had a recent decline in their functional status and demonstrates the ability to make significant improvements in function in a reasonable and predictable amount of time.     Precautions / Restrictions Precautions Precautions: Fall      Mobility  Bed Mobility Overal bed mobility: Needs Assistance Bed Mobility: Supine to Sit, Sit to Supine     Supine to sit: Mod assist Sit to supine:  Mod assist        Transfers Overall transfer level: Needs assistance   Transfers: Bed to chair/wheelchair/BSC            Lateral/Scoot Transfers: Mod assist      Ambulation/Gait               General Gait Details: NT today, await shoes from home.  Stairs            Wheelchair Mobility    Modified Rankin (Stroke Patients Only)       Balance Overall balance assessment: Needs assistance Sitting-balance support: No upper extremity supported, Feet supported, Bilateral upper extremity supported Sitting balance-Leahy Scale: Fair Sitting balance - Comments: after initial move to EOB, pt sat unassisted, no UE assist                                     Pertinent Vitals/Pain Pain Assessment Pain Assessment: Faces Faces Pain Scale: No hurt Pain Intervention(s): Monitored during session    Home Living Family/patient expects to be discharged to:: Private residence (Family home with 24/7 care.) Living Arrangements: Non-relatives/Friends Available Help at Discharge: Personal care attendant;Available 24 hours/day           Home Layout: One level        Prior Function Prior Level of Function : Needs assist             Mobility Comments: ambulates with shoes, HHA, no AD, loves pushing cart ADLs Comments: assisted     Hand Dominance        Extremity/Trunk Assessment   Upper Extremity Assessment Upper  Extremity Assessment: Generalized weakness    Lower Extremity Assessment Lower Extremity Assessment: Generalized weakness    Cervical / Trunk Assessment Cervical / Trunk Assessment: Kyphotic;Normal  Communication   Communication:  (mute)  Cognition Arousal/Alertness: Awake/alert Behavior During Therapy: Flat affect, WFL for tasks assessed/performed Overall Cognitive Status: History of cognitive impairments - at baseline                                          General Comments      Exercises      Assessment/Plan    PT Assessment Patient needs continued PT services  PT Problem List Decreased strength;Decreased activity tolerance;Decreased mobility;Decreased coordination;Cardiopulmonary status limiting activity       PT Treatment Interventions Gait training;DME instruction;Functional mobility training;Therapeutic activities;Therapeutic exercise;Patient/family education;Balance training    PT Goals (Current goals can be found in the Care Plan section)  Acute Rehab PT Goals Patient Stated Goal: pt unable, Dad agrees working on [employer] and Investment banker, operational. PT Goal Formulation: With patient/family Time For Goal Achievement: 12/01/21 Potential to Achieve Goals: Good    Frequency Min 3X/week     Co-evaluation               AM-PAC PT "6 Clicks" Mobility  Outcome Measure Help needed turning from your back to your side while in a flat bed without using bedrails?: A Lot Help needed moving from lying on your back to sitting on the side of a flat bed without using bedrails?: A Lot Help needed moving to and from a bed to a chair (including a wheelchair)?: A Lot Help needed standing up from a chair using your arms (e.g., wheelchair or bedside chair)?: A Lot Help needed to walk in hospital room?: Total Help needed climbing 3-5 steps with a railing? : Total 6 Click Score: 10    End of Session Equipment Utilized During Treatment: Oxygen Activity Tolerance: Patient tolerated treatment well;Patient limited by fatigue Patient left: in bed;with call bell/phone within reach Nurse Communication: Mobility status PT Visit Diagnosis: Muscle weakness (generalized) (M62.81);Other abnormalities of gait and mobility (R26.89)    Time: 8295-6213 PT Time Calculation (min) (ACUTE ONLY): 27 min   Charges:   PT Evaluation $PT Eval Moderate Complexity: 1 Mod PT Treatments $Therapeutic Activity: 8-22 mins        11/17/2021  Jacinto Halim., PT Acute Rehabilitation Services [phone]   (pager) [phone]  (office)  Eliseo Gum Mariel Lukins 11/17/2021, 6:13 PM

## 2021-11-17 NOTE — Progress Notes (Signed)
Nutrition Follow-up  DOCUMENTATION CODES:   Not applicable  INTERVENTION:   Continue tube feeding via Cortrak tube: Vital AF 1.2 at 70 ml/h (1680 ml per day).  Provides 2016 kcal, 126 gm protein, 1362 ml free water daily.  Plans for  SLP evaluation prior to advancing PO diet.   NUTRITION DIAGNOSIS:   Inadequate oral intake related to inability to eat as evidenced by NPO status.  Ongoing   GOAL:   Patient will meet greater than or equal to 90% of their needs  Met with TF  MONITOR:   Vent status, TF tolerance, Labs  REASON FOR ASSESSMENT:   Ventilator, Consult Enteral/tube feeding initiation and management  ASSESSMENT:   31 yo male admitted with sepsis d/t aspiration PNA, AKI. 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  Discussed patient in ICU rounds and with RN today. Extubated 8/23. Cortrak unclogged by Cortrak RD on 8/23. Tolerating TF well at goal rate: Vital AF 1.2 at 70 ml/h. Free water flushes 200 ml every 4 hours.  Remains NPO. Needs SLP evaluation prior to beginning PO diet.  Labs reviewed. Na 147 CBG: [phone]  Medications reviewed and include Decadron, Banatrol.  Admission weight 71.6 kg, current weight 71.2 kg.  Intake/Output Summary (Last 24 hours) at 11/17/2021 1348 Last data filed at 11/17/2021 1200 Gross per 24 hour  Intake 1985.28 ml  Output 2010 ml  Net -24.72 ml   Net IO Since Admission: 7,474.9 mL [11/17/21 1348]   Diet Order:   Diet Order             Diet NPO time specified  Diet effective midnight                   EDUCATION NEEDS:   No education needs have been identified at this time  Skin:  Skin Assessment: Reviewed RN Assessment (MASD to L thigh)  Last BM:  8/23 type 7  Height:   Ht Readings from Last 1 Encounters:  11/10/21 _0  (1.676 m)    Weight:   Wt Readings from Last 1 Encounters:  11/17/21 71.2 kg    Ideal Body Weight:  64.5 kg  BMI:  Body mass index is 25.34  kg/m.  Estimated Nutritional Needs:   Kcal:  2000-2300  Protein:  100-120 gm  Fluid:  2-2.3 L   Lucas Mallow RD, LDN, CNSC Please refer to Amion for contact information.

## 2021-11-17 NOTE — Progress Notes (Signed)
(  L)chest tube placed by IR service 8/22 for possible empyema. Output has been minimal and CXR shows small residual effusion. PCCM team has requested chest tube removal.  Chest tube removed at bedside without difficulty. Port CXR ordered.  Brayton El PA-C Interventional Radiology 11/17/2021 11:32 AM

## 2021-11-18 ENCOUNTER — Inpatient Hospital Stay (HOSPITAL_COMMUNITY): Payer: Medicare Other

## 2021-11-18 DIAGNOSIS — J189 Pneumonia, unspecified organism: Secondary | ICD-10-CM | POA: Diagnosis not present

## 2021-11-18 DIAGNOSIS — J9601 Acute respiratory failure with hypoxia: Secondary | ICD-10-CM | POA: Diagnosis not present

## 2021-11-18 DIAGNOSIS — R6521 Severe sepsis with septic shock: Secondary | ICD-10-CM

## 2021-11-18 DIAGNOSIS — A419 Sepsis, unspecified organism: Secondary | ICD-10-CM | POA: Diagnosis not present

## 2021-11-18 LAB — POCT I-STAT 7, (LYTES, BLD GAS, ICA,H+H)
Acid-Base Excess: 2 mmol/L (ref 0.0–2.0)
Bicarbonate: 28.2 mmol/L — ABNORMAL HIGH (ref 20.0–28.0)
Calcium, Ion: 1.21 mmol/L (ref 1.15–1.40)
HCT: 30 % — ABNORMAL LOW (ref 39.0–52.0)
Hemoglobin: 10.2 g/dL — ABNORMAL LOW (ref 13.0–17.0)
O2 Saturation: 93 %
Patient temperature: 99.8
Potassium: 4.3 mmol/L (ref 3.5–5.1)
Sodium: 148 mmol/L — ABNORMAL HIGH (ref 135–145)
TCO2: 30 mmol/L (ref 22–32)
pCO2 arterial: 54.5 mmHg — ABNORMAL HIGH (ref 32–48)
pH, Arterial: 7.325 — ABNORMAL LOW (ref 7.35–7.45)
pO2, Arterial: 77 mmHg — ABNORMAL LOW (ref 83–108)

## 2021-11-18 LAB — BASIC METABOLIC PANEL
Anion gap: 7 (ref 5–15)
BUN: 12 mg/dL (ref 6–20)
CO2: 26 mmol/L (ref 22–32)
Calcium: 8.4 mg/dL — ABNORMAL LOW (ref 8.9–10.3)
Chloride: 116 mmol/L — ABNORMAL HIGH (ref 98–111)
Creatinine, Ser: 0.59 mg/dL — ABNORMAL LOW (ref 0.61–1.24)
GFR, Estimated: >60 mL/min (ref >60)
Glucose, Bld: 130 mg/dL — ABNORMAL HIGH (ref 70–99)
Potassium: 3.9 mmol/L (ref 3.5–5.1)
Sodium: 149 mmol/L — ABNORMAL HIGH (ref 135–145)

## 2021-11-18 LAB — CBC
HCT: 31.9 % — ABNORMAL LOW (ref 39.0–52.0)
Hemoglobin: 10 g/dL — ABNORMAL LOW (ref 13.0–17.0)
MCH: 30.9 pg (ref 26.0–34.0)
MCHC: 31.3 g/dL (ref 30.0–36.0)
MCV: 98.5 fL (ref 80.0–100.0)
Platelets: 501 10*3/uL — ABNORMAL HIGH (ref 150–400)
RBC: 3.24 MIL/uL — ABNORMAL LOW (ref 4.22–5.81)
RDW: 13.5 % (ref 11.5–15.5)
WBC: 10.5 10*3/uL (ref 4.0–10.5)
nRBC: 0 % (ref 0.0–0.2)

## 2021-11-18 LAB — GLUCOSE, CAPILLARY
Glucose-Capillary: 111 mg/dL — ABNORMAL HIGH (ref 70–99)
Glucose-Capillary: 120 mg/dL — ABNORMAL HIGH (ref 70–99)
Glucose-Capillary: 136 mg/dL — ABNORMAL HIGH (ref 70–99)
Glucose-Capillary: 137 mg/dL — ABNORMAL HIGH (ref 70–99)
Glucose-Capillary: 74 mg/dL (ref 70–99)
Glucose-Capillary: 82 mg/dL (ref 70–99)
Glucose-Capillary: 87 mg/dL (ref 70–99)

## 2021-11-18 LAB — CULTURE, BODY FLUID W GRAM STAIN -BOTTLE: Culture: NO GROWTH

## 2021-11-18 LAB — MAGNESIUM: Magnesium: 2 mg/dL (ref 1.7–2.4)

## 2021-11-18 LAB — PHOSPHORUS: Phosphorus: 2.5 mg/dL (ref 2.5–4.6)

## 2021-11-18 MED ORDER — MIDAZOLAM HCL 2 MG/2ML IJ SOLN
INTRAMUSCULAR | Status: AC
  Administered 2021-11-18: 2 mg
  Filled 2021-11-18: qty 2

## 2021-11-18 MED ORDER — PANTOPRAZOLE 2 MG/ML SUSPENSION
40.0000 mg | Freq: Every day | ORAL | Status: DC
  Administered 2021-11-18 – 2021-11-22 (×5): 40 mg
  Filled 2021-11-18 (×5): qty 20

## 2021-11-18 MED ORDER — ETOMIDATE 2 MG/ML IV SOLN
INTRAVENOUS | Status: AC
  Administered 2021-11-18: 20 mg
  Filled 2021-11-18: qty 20

## 2021-11-18 MED ORDER — AMIODARONE HCL IN DEXTROSE 360-4.14 MG/200ML-% IV SOLN
60.0000 mg/h | INTRAVENOUS | Status: AC
  Administered 2021-11-18 (×2): 60 mg/h via INTRAVENOUS
  Filled 2021-11-18 (×2): qty 200

## 2021-11-18 MED ORDER — MIDAZOLAM HCL 2 MG/2ML IJ SOLN
INTRAMUSCULAR | Status: AC
  Filled 2021-11-18: qty 2

## 2021-11-18 MED ORDER — FENTANYL BOLUS VIA INFUSION
50.0000 ug | INTRAVENOUS | Status: DC | PRN
  Administered 2021-11-18 (×2): 50 ug via INTRAVENOUS
  Administered 2021-11-18: 25 ug via INTRAVENOUS
  Administered 2021-11-18: 50 ug via INTRAVENOUS

## 2021-11-18 MED ORDER — AMIODARONE HCL IN DEXTROSE 360-4.14 MG/200ML-% IV SOLN
30.0000 mg/h | INTRAVENOUS | Status: DC
  Administered 2021-11-18: 30 mg/h via INTRAVENOUS

## 2021-11-18 MED ORDER — METOCLOPRAMIDE HCL 5 MG PO TABS
5.0000 mg | ORAL_TABLET | Freq: Four times a day (QID) | ORAL | Status: DC
  Administered 2021-11-18 – 2021-11-20 (×8): 5 mg
  Filled 2021-11-18 (×9): qty 1

## 2021-11-18 MED ORDER — FENTANYL CITRATE PF 50 MCG/ML IJ SOSY: 50.0000 ug | PREFILLED_SYRINGE | Freq: Once | INTRAMUSCULAR | Status: AC

## 2021-11-18 MED ORDER — AMIODARONE LOAD VIA INFUSION
150.0000 mg | Freq: Once | INTRAVENOUS | Status: AC
  Administered 2021-11-18: 150 mg via INTRAVENOUS
  Filled 2021-11-18: qty 83.34

## 2021-11-18 MED ORDER — SUCCINYLCHOLINE CHLORIDE 200 MG/10ML IV SOSY
PREFILLED_SYRINGE | INTRAVENOUS | Status: AC
  Filled 2021-11-18: qty 10

## 2021-11-18 MED ORDER — ORAL CARE MOUTH RINSE: 15.0000 mL | OROMUCOSAL | Status: DC | PRN

## 2021-11-18 MED ORDER — METOPROLOL TARTRATE 12.5 MG HALF TABLET
12.5000 mg | ORAL_TABLET | Freq: Three times a day (TID) | ORAL | Status: DC
  Administered 2021-11-18 – 2021-11-19 (×3): 12.5 mg
  Filled 2021-11-18 (×3): qty 1

## 2021-11-18 MED ORDER — FREE WATER
200.0000 mL | Status: DC
  Administered 2021-11-18 – 2021-11-19 (×7): 200 mL

## 2021-11-18 MED ORDER — MIDAZOLAM HCL 2 MG/2ML IJ SOLN
2.0000 mg | INTRAMUSCULAR | Status: AC | PRN
  Administered 2021-11-18 – 2021-11-19 (×3): 2 mg via INTRAVENOUS
  Filled 2021-11-18 (×2): qty 2

## 2021-11-18 MED ORDER — PHENYLEPHRINE 80 MCG/ML (10ML) SYRINGE FOR IV PUSH (FOR BLOOD PRESSURE SUPPORT)
PREFILLED_SYRINGE | INTRAVENOUS | Status: AC
  Filled 2021-11-18: qty 10

## 2021-11-18 MED ORDER — FENTANYL CITRATE PF 50 MCG/ML IJ SOSY
PREFILLED_SYRINGE | INTRAMUSCULAR | Status: AC
  Administered 2021-11-18: 50 ug via INTRAVENOUS
  Filled 2021-11-18: qty 2

## 2021-11-18 MED ORDER — MIDAZOLAM HCL 2 MG/2ML IJ SOLN
2.0000 mg | INTRAMUSCULAR | Status: DC | PRN
  Administered 2021-11-19 – 2021-11-20 (×4): 2 mg via INTRAVENOUS
  Filled 2021-11-18 (×4): qty 2

## 2021-11-18 MED ORDER — FENTANYL 2500MCG IN NS 250ML (10MCG/ML) PREMIX INFUSION
50.0000 ug/h | INTRAVENOUS | Status: DC
  Administered 2021-11-18: 50 ug/h via INTRAVENOUS
  Filled 2021-11-18: qty 250

## 2021-11-18 MED ORDER — KETAMINE HCL 50 MG/5ML IJ SOSY
PREFILLED_SYRINGE | INTRAMUSCULAR | Status: AC
  Filled 2021-11-18: qty 5

## 2021-11-18 MED ORDER — ROCURONIUM BROMIDE 10 MG/ML (PF) SYRINGE
PREFILLED_SYRINGE | INTRAVENOUS | Status: AC
  Filled 2021-11-18: qty 10

## 2021-11-18 MED ORDER — POLYETHYLENE GLYCOL 3350 17 G PO PACK
17.0000 g | PACK | Freq: Every day | ORAL | Status: DC
  Administered 2021-11-19: 17 g
  Filled 2021-11-18: qty 1

## 2021-11-18 MED ORDER — DOCUSATE SODIUM 50 MG/5ML PO LIQD
100.0000 mg | Freq: Two times a day (BID) | ORAL | Status: DC
  Administered 2021-11-18 – 2021-11-19 (×4): 100 mg
  Filled 2021-11-18 (×4): qty 10

## 2021-11-18 MED ORDER — ORAL CARE MOUTH RINSE
15.0000 mL | OROMUCOSAL | Status: DC
  Administered 2021-11-18 – 2021-11-21 (×40): 15 mL via OROMUCOSAL

## 2021-11-18 NOTE — TOC Progression Note (Signed)
Transition of Care [facility]) - Progression Note    Patient Details  Name: Todd Snyder MRN: [id number] Date of Birth: 25-Jul-1990  [facility] CM/SW Contact  Beckie Busing, RN Phone Number:[phone]  11/18/2021, 2:40 PM  Clinical Narrative:    TOC continues to follow for disposition needs. Patient is currently not medically stable and has been re intubated. TOC will follow for disposition planning.         Expected Discharge Plan and Services                                                 Social Determinants of Health (SDOH) Interventions    Readmission Risk Interventions     No data to display

## 2021-11-18 NOTE — Procedures (Signed)
Intubation Procedure Note  AMERIGO MCGLORY  [id number]  Sep 01, 1990  Date:11/18/21  Time:1:04 AM   Provider Performing:Jenille Laszlo R Ohana Birdwell    Procedure: Intubation (31500)  Indication(s) Respiratory Failure  Consent Unable to obtain consent due to emergent nature of procedure.   Anesthesia Etomidate, Versed, and Fentanyl   Time Out Verified patient identification, verified procedure, site/side was marked, verified correct patient position, special equipment/implants available, medications/allergies/relevant history reviewed, required imaging and test results available.   Sterile Technique Usual hand hygeine, masks, and gloves were used   Procedure Description Patient positioned in bed supine.  Sedation given as noted above.  Patient was intubated with endotracheal tube using Glidescope.  View was Grade 2 only posterior commissure .  Number of attempts was 1.  Colorimetric CO2 detector was consistent with tracheal placement.   Complications/Tolerance None; patient tolerated the procedure well. Chest X-ray is ordered to verify placement.   EBL Minimal   Specimen(s) None   Darcella Gasman Shawndale Kilpatrick, PA-C

## 2021-11-18 NOTE — Progress Notes (Addendum)
NAME:  Todd Snyder, MRN:  [id number], DOB:  07/24/1990, LOS: 12 ADMISSION DATE:  10/28/2021,  CHIEF COMPLAINT:  Dyspnea, weakness  History of Present Illness:  70M with a hx of autism. Was well until 2 days prior to admission when he was noted to have nausea, vomiting, poor PO intake. Experienced progressive weakness, tachypnea, tachycardia. EMS was call and he was found in respiratory distress.  In ED required intubation and MV. Eval revealed LLL PNA, acute renal failure. Also question ileus vs bowel obstruction. Transfers to [facility] with respiratory failure, septic shock and acute renal failure.   Pertinent  Medical History   Past Medical History:  Diagnosis Date   Autism    Bilateral club feet     Significant Hospital Events: Including procedures, antibiotic start and stop dates in addition to other pertinent events   8/13 admitted, intubated, bronched.  8/14 clinically no ileus or SBO, no NGT output, so TF started 8/19 CXR not better despite CPT & nebs. CT shows posterior predominant loculated effusion. Still not amenable to bedside placement due to placement of effusion. IR consulted for CT-guided chest tube. 8/23 extubated, continues to have secretions  Interim History / Subjective:  Copious overnight secretions with O2 sat 60's.  Intubated. Vomited post intubation.   Subjective: Tracts speaker, does not follow commands.  Objective   Blood pressure 93/64, pulse (!) 111, temperature 98.8 F (37.1 C), temperature source Oral, resp. rate 16, height 5\' 6"  (1.676 m), weight 72.1 kg, SpO2 100 %.    Vent Mode: PRVC FiO2 (%):  [50 %-70 %] 50 % Set Rate:  [16 bmp] 16 bmp Vt Set:  [500 mL-510 mL] 510 mL PEEP:  [5 cmH20] 5 cmH20 Plateau Pressure:  [20 cmH20] 20 cmH20   Intake/Output Summary (Last 24 hours) at 11/18/2021 0811 Last data filed at 11/18/2021 0700 Gross per 24 hour  Intake 2330.81 ml  Output 1510 ml  Net 820.81 ml   Filed Weights   11/16/21 0448 11/17/21 0500  11/18/21 0500  Weight: 74.6 kg 71.2 kg 72.1 kg    Examination: General:  chronically ill appearing man lying in bed in NAD HEENT: [place]/AT, eyes anicteric, oral mucosa moist. [place] in place Neuro: alert, looking around the room, not moving much but moves when stimulated. CV: S1S2, tachycardic, irregularly irregular PULM:  decreased air sounds in left lower lobe, mechanically ventilated GI: soft, NT, OG tube to LIWS Extremities: mild pedal edema, no cyanosis Skin: no rashes, skin warm & dry   Resolved Hospital Problem list   Transaminase elevation AKI Hypernatremia Hypokalemia Hypophosphatemia Thrombocytopenia Septic shock AKI Supraventricular arrhythmia with tachycardia 2/2 sepsis gastroenteritis  Assessment & Plan:   Acute respiratory failure LLL E Coli Pneumonia, complicated by loculated pleural effusion posteriorly and pneumothorax Patient extubated 8/23 with tachypnea and shallow breathing but able to maintain sats. He required re-intubated overnight 8/25 due to copious secretions and dropping O2 sat to 60's. Vomited post-intubation. Repeat CXR showed stable aeration and known necrotic portion and stable effusion. On CTX which will cover for aspiration. Chest tube removed yesterday - continue CTX with completion of course 8/30 - mechanical ventilation and wean as able. Patient has profoundly weak muscles, weaning may be difficult. Patient may require trach for long term ventilation. - ensure adequate suction of oral secretions - will need soft restraints despite extubation. Per father, patient has tendency to pull at lines even at cognitive baseline.  - Bronchodilators - CPT  Dysphagia SLP evaluated the patient and recommended  NPO. Patient has had recurrent vomiting and complications from aspiration. He is unable to clear his secretions due to weak gag/cough reflex.  - may benefit from PEG placement. - monitor for TF intolerance. Hold tube feeds while NG on suction - ensure  adequate suction of oral secretions - banatrol  - daily miralax - Continues to have diarrhea. FMS in place.  AFIB with RVR Induced by respiratory distress. Did not resolve post-intubation. Placed on amio drip. Patient does not appear to be in pain. RVR resolved. Similar episode early in admission. Has same risk of embolism as paroxsymal afib, will need AC - start metop 12.5 TID for rate control -  will need to be anticoagulated  Acute septic encephalopathy- improving Hx of ataxic cerebral palsy and autism:  Baseline nonverbal; communicates with hand signals. Can mostly feed himself (spoon more easily than fork), can dress himself with minimal assistance.  -minimize sedation as able -Anticipate a prolonged recovery  At risk for malnutrition - Continue postpyloric feeding via core track at full rate  Hypernatremia - free water 200cc  q3h -trend BMP  Left bacterial vs. Viral conjunctivitis -con't antibiotic eye drops   Best Practice (right click and "Reselect all SmartList Selections" daily)   Diet/type: tubefeeds DVT prophylaxis: prophylactic heparin  GI prophylaxis: famotidine Lines: N/A Foley:  N/A Code Status:  full code Last date of multidisciplinary goals of care discussion [father updated daily at bedside]

## 2021-11-18 NOTE — Progress Notes (Signed)
RT  called to bedside due to pt. Saturation dropping rapidly RT start bagging until md arrived, pt. Was intubated with a 7.5 et tube 26@lip . Abg to follow.

## 2021-11-18 NOTE — Progress Notes (Signed)
Nutrition Follow-up  DOCUMENTATION CODES:   Not applicable  INTERVENTION:   Continue tube feeding via Cortrak tube: Vital AF 1.2 at 20 ml/h, recommend increase by 10 ml every 4 hours to goal rate of 70 ml/h (1680 ml per day).  Provides 2016 kcal, 126 gm protein, 1362 ml free water daily.  NUTRITION DIAGNOSIS:   Inadequate oral intake related to inability to eat as evidenced by NPO status.  Ongoing   GOAL:   Patient will meet greater than or equal to 90% of their needs  Currently unmet, TF at trickle rate  MONITOR:   Vent status, TF tolerance, Labs  REASON FOR ASSESSMENT:   Ventilator, Consult Enteral/tube feeding initiation and management  ASSESSMENT:   31 yo male admitted with sepsis d/t aspiration PNA, AKI. 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  Re-intubated last night. Cortrak in place with tip near the pylorus.  TF held last night when patient vomited. It has been resumed at 20 ml/h.  Free water flushes 200 ml every 3 hours.   Patient is currently intubated on ventilator support Temp (24hrs), Avg:98.9 F (37.2 C), Min:97.5 F (36.4 C), Max:99.9 F (37.7 C)   Labs reviewed. Na 148 CBG: [id number]  Medications reviewed and include Colace, Banatrol, Reglan, Decadron, Banatrol.  Admission weight 71.6 kg, current weight 72.1 kg.  Intake/Output Summary (Last 24 hours) at 11/18/2021 1456 Last data filed at 11/18/2021 1400 Gross per 24 hour  Intake 1989.09 ml  Output 1155 ml  Net 834.09 ml   Net IO Since Admission: 8,448.99 mL [11/18/21 1456]   Diet Order:   Diet Order             Diet NPO time specified  Diet effective now                   EDUCATION NEEDS:   No education needs have been identified at this time  Skin:  Skin Assessment: Reviewed RN Assessment (MASD to L thigh)  Last BM:  8/25 type 7  Height:   Ht Readings from Last 1 Encounters:  11/10/21 5\' 6"  (1.676 m)    Weight:   Wt Readings from Last 1  Encounters:  11/18/21 72.1 kg    Ideal Body Weight:  64.5 kg  BMI:  Body mass index is 25.66 kg/m.  Estimated Nutritional Needs:   Kcal:  2000-2300  Protein:  100-120 gm  Fluid:  2-2.3 L   11/20/21 RD, LDN, CNSC Please refer to Amion for contact information.

## 2021-11-18 NOTE — Progress Notes (Signed)
eLink Physician-Brief Progress Note Patient Name: Todd Snyder DOB: [birth year] MRN: [id number]   Date of Service  11/18/2021  HPI/Events of Note  Hypoxia - Patient desaturated to 60% d/t copious secretions, weak cough and likely retained secretions. Nursing able to bag patient back up to 100%.  eICU Interventions  Plan: Will request that the PCCM ground team evaluate the patient at bedside for likely reintubation.      Intervention Category Major Interventions: Hypoxemia - evaluation and management  Ashlei Chinchilla Dennard Nip 11/18/2021, 12:38 AM

## 2021-11-18 NOTE — Progress Notes (Addendum)
PCCM interval progress note:  Pt had increasing secretions and inability to effectively clear them overnight, his oxygen levels dropped and PCCM called to the bedside.  He was being bagged on arrival and required re-intubation.  In afib RVR which did not improve with decreased respiratory distress and sedation, amiodarone infusion ordered.   Father updated with overnight events.   Darcella Gasman Kristie Bracewell, PA-C

## 2021-11-18 NOTE — Progress Notes (Signed)
ETT holder changed. No complications noted. °

## 2021-11-19 DIAGNOSIS — A4151 Sepsis due to Escherichia coli [E. coli]: Secondary | ICD-10-CM | POA: Diagnosis not present

## 2021-11-19 DIAGNOSIS — R6520 Severe sepsis without septic shock: Secondary | ICD-10-CM | POA: Diagnosis not present

## 2021-11-19 DIAGNOSIS — J9601 Acute respiratory failure with hypoxia: Secondary | ICD-10-CM | POA: Diagnosis not present

## 2021-11-19 LAB — GLUCOSE, CAPILLARY
Glucose-Capillary: 105 mg/dL — ABNORMAL HIGH (ref 70–99)
Glucose-Capillary: 98 mg/dL (ref 70–99)
Glucose-Capillary: 94 mg/dL (ref 70–99)
Glucose-Capillary: 94 mg/dL (ref 70–99)
Glucose-Capillary: 94 mg/dL (ref 70–99)
Glucose-Capillary: 98 mg/dL (ref 70–99)

## 2021-11-19 LAB — CBC
HCT: 29.7 % — ABNORMAL LOW (ref 39.0–52.0)
Hemoglobin: 9.6 g/dL — ABNORMAL LOW (ref 13.0–17.0)
MCH: 31.2 pg (ref 26.0–34.0)
MCHC: 32.3 g/dL (ref 30.0–36.0)
MCV: 96.4 fL (ref 80.0–100.0)
Platelets: 385 10*3/uL (ref 150–400)
RBC: 3.08 MIL/uL — ABNORMAL LOW (ref 4.22–5.81)
RDW: 13.8 % (ref 11.5–15.5)
WBC: 8.9 10*3/uL (ref 4.0–10.5)
nRBC: 0 % (ref 0.0–0.2)

## 2021-11-19 LAB — BASIC METABOLIC PANEL
Anion gap: 6 (ref 5–15)
BUN: 17 mg/dL (ref 6–20)
CO2: 26 mmol/L (ref 22–32)
Calcium: 8.1 mg/dL — ABNORMAL LOW (ref 8.9–10.3)
Chloride: 110 mmol/L (ref 98–111)
Creatinine, Ser: 0.61 mg/dL (ref 0.61–1.24)
GFR, Estimated: >60 mL/min (ref >60)
Glucose, Bld: 104 mg/dL — ABNORMAL HIGH (ref 70–99)
Potassium: 3.6 mmol/L (ref 3.5–5.1)
Sodium: 142 mmol/L (ref 135–145)

## 2021-11-19 LAB — PHOSPHORUS: Phosphorus: 2.5 mg/dL (ref 2.5–4.6)

## 2021-11-19 LAB — MAGNESIUM: Magnesium: 1.9 mg/dL (ref 1.7–2.4)

## 2021-11-19 MED ORDER — FENTANYL CITRATE PF 50 MCG/ML IJ SOSY
50.0000 ug | PREFILLED_SYRINGE | INTRAMUSCULAR | Status: DC | PRN
  Administered 2021-11-20 (×2): 50 ug via INTRAVENOUS
  Administered 2021-11-20: 100 ug via INTRAVENOUS
  Administered 2021-11-20: 50 ug via INTRAVENOUS
  Administered 2021-11-20: 100 ug via INTRAVENOUS
  Filled 2021-11-19: qty 1
  Filled 2021-11-19: qty 2
  Filled 2021-11-19 (×2): qty 1
  Filled 2021-11-19: qty 2

## 2021-11-19 MED ORDER — DOXAZOSIN MESYLATE 2 MG PO TABS
2.0000 mg | ORAL_TABLET | Freq: Every day | ORAL | Status: AC
  Administered 2021-11-19 – 2021-11-21 (×3): 2 mg
  Filled 2021-11-19 (×3): qty 1

## 2021-11-19 MED ORDER — POTASSIUM CHLORIDE 20 MEQ PO PACK
40.0000 meq | PACK | Freq: Once | ORAL | Status: AC
  Administered 2021-11-19: 40 meq
  Filled 2021-11-19: qty 2

## 2021-11-19 MED ORDER — FREE WATER
200.0000 mL | Status: DC
Start: 2021-11-19 — End: 2021-11-20
  Administered 2021-11-19 – 2021-11-20 (×5): 200 mL

## 2021-11-19 MED ORDER — METOPROLOL TARTRATE 12.5 MG HALF TABLET
12.5000 mg | ORAL_TABLET | Freq: Two times a day (BID) | ORAL | Status: DC
Start: 2021-11-19 — End: 2021-11-22
  Administered 2021-11-19 – 2021-11-21 (×5): 12.5 mg
  Filled 2021-11-19 (×5): qty 1

## 2021-11-19 MED ORDER — MAGNESIUM SULFATE 2 GM/50ML IV SOLN
2.0000 g | Freq: Once | INTRAVENOUS | Status: AC
  Administered 2021-11-19: 2 g via INTRAVENOUS
  Filled 2021-11-19: qty 50

## 2021-11-19 NOTE — Progress Notes (Addendum)
NAME:  Todd Snyder, MRN:  [id number], DOB:  05-Jan-1991, LOS: 13 ADMISSION DATE:  11/07/2021,  CHIEF COMPLAINT:  Dyspnea, weakness  History of Present Illness:  15M with a hx of autism. Was well until 2 days prior to admission when he was noted to have nausea, vomiting, poor PO intake. Experienced progressive weakness, tachypnea, tachycardia. EMS was call and he was found in respiratory distress.  In ED required intubation and MV. Eval revealed LLL PNA, acute renal failure. Also question ileus vs bowel obstruction. Transfers to [facility] with respiratory failure, septic shock and acute renal failure.   Pertinent  Medical History   Past Medical History:  Diagnosis Date   Autism    Bilateral club feet     Significant Hospital Events: Including procedures, antibiotic start and stop dates in addition to other pertinent events   8/13 admitted, intubated, bronched.  8/14 clinically no ileus or SBO, no NGT output, so TF started 8/19 CXR not better despite CPT & nebs. CT shows posterior predominant loculated effusion. Still not amenable to bedside placement due to placement of effusion. IR consulted for CT-guided chest tube. 8/23 extubated, continues to have secretions 8/25 copious secretions overnight, hypoxia, intubated. Vomited post intubation   Interim History / Subjective:  No events overnight.   Objective   Blood pressure 104/66, pulse 85, temperature 99.5 F (37.5 C), temperature source Axillary, resp. rate 16, height 5\' 6"  (1.676 m), weight 73 kg, SpO2 100 %.    Vent Mode: PRVC FiO2 (%):  [40 %] 40 % Set Rate:  [16 bmp] 16 bmp Vt Set:  [510 mL] 510 mL PEEP:  [5 cmH20] 5 cmH20 Plateau Pressure:  [19 cmH20-23 cmH20] 19 cmH20   Intake/Output Summary (Last 24 hours) at 11/19/2021 0739 Last data filed at 11/19/2021 0700 Gross per 24 hour  Intake 1757.72 ml  Output 940 ml  Net 817.72 ml   Filed Weights   11/17/21 0500 11/18/21 0500 11/19/21 0617  Weight: 71.2 kg 72.1 kg 73 kg     Examination: General:  chronically ill appearing man lying in bed on vent  HEENT: ETT/OG in place  Neuro: alert, tracks examiner, does not follow commands  CV: RRR, HR 88, no mRG PULM:  diminished breath sounds to left lung, no use of accessory muscles  GI: soft, non-distended, active bowel sounds  Extremities: mild pedal edema, no cyanosis Skin: no rashes, skin warm & dry   Resolved Hospital Problem list   Transaminase elevation AKI Hypernatremia Hypokalemia Hypophosphatemia Thrombocytopenia Septic shock AKI Supraventricular arrhythmia with tachycardia 2/2 sepsis gastroenteritis  Assessment & Plan:   Acute respiratory failure LLL E Coli Pneumonia, complicated by loculated pleural effusion posteriorly and pneumothorax Patient extubated 8/23 with tachypnea and shallow breathing but able to maintain sats. He required re-intubated overnight 8/25 due to copious secretions and dropping O2 sat to 60's. Vomited post-intubation. Repeat CXR showed stable aeration and known necrotic portion and stable effusion. On CTX which will cover for aspiration. Chest tube removed 8/24 Plan - continue CTX with completion of course 8/30 - mechanical ventilation and wean as able. (Patient has profoundly weak muscles, weaning may be difficult. Patient may require trach for long term ventilation.) - Daily SBT  - Continue scheduled CPT and Bronchodilators   Dysphagia SLP evaluated the patient and recommended NPO. Patient has had recurrent vomiting and complications from aspiration. He is unable to clear his secretions due to weak gag/cough reflex.  Plan  - may benefit from PEG placement. - monitor  for TF intolerance. Hold tube feeds while NG on suction - ensure adequate suction of oral secretions - banatrol  - Bowel regimen as needed  - Continues to have diarrhea. FMS in place.  AFIB with RVR Induced by respiratory distress. Did not resolve post-intubation. Placed on amio drip. Patient does  not appear to be in pain. RVR resolved. Similar episode early in admission. Has same risk of embolism as paroxsymal afib, will need AC Plan - continuous cardiac monitoring  - continue metop 12.5 TID for rate control - will need to be anticoagulated  Acute septic encephalopathy- improving Hx of ataxic cerebral palsy and autism:  Baseline nonverbal; communicates with hand signals. Can mostly feed himself (spoon more easily than fork), can dress himself with minimal assistance.  Plan - PRN Fentanyl/Versed for RASS goal 0/-1 - Anticipate a prolonged recovery  At risk for malnutrition - Continue postpyloric feeding via core track at full rate  Hypernatremia - free water 200cc q4h - trend BMP  Left bacterial vs. Viral conjunctivitis - con't antibiotic eye drops   Best Practice (right click and "Reselect all SmartList Selections" daily)   Diet/type: tubefeeds DVT prophylaxis: prophylactic heparin  GI prophylaxis: PPI Lines: N/A Foley:  N/A Code Status:  full code Last date of multidisciplinary goals of care discussion [father updated daily at bedside]   CRITICAL CARE Performed by: Tobey Grim   Total critical care time: 35 minutes  Critical care time was exclusive of separately billable procedures and treating other patients.  Critical care was necessary to treat or prevent imminent or life-threatening deterioration.  Critical care was time spent personally by me on the following activities: development of treatment plan with patient and/or surrogate as well as nursing, discussions with consultants, evaluation of patient's response to treatment, examination of patient, obtaining history from patient or surrogate, ordering and performing treatments and interventions, ordering and review of laboratory studies, ordering and review of radiographic studies, pulse oximetry and re-evaluation of patient's condition.

## 2021-11-19 NOTE — Progress Notes (Signed)
[facility] ADULT ICU REPLACEMENT PROTOCOL   The patient does apply for the [facility] Adult ICU Electrolyte Replacment Protocol based on the criteria listed below:   1.Exclusion criteria: TCTS patients, ECMO patients, and Dialysis patients 2. Is GFR >/= 30 ml/min? Yes.    Patient's GFR today is >60 3. Is SCr </= 2? Yes.   Patient's SCr is 0.61 mg/dL 4. Did SCr increase >/= 0.5 in 24 hours? No. 5.Pt's weight >40kg  Yes.   6. Abnormal electrolyte(s): K+ 3.6, mag 1.9  7. Electrolytes replaced per protocol 8.  Call MD STAT for K+ </= 2.5, Phos </= 1, or Mag </= 1 Physician:  n/a  Todd Snyder 11/19/2021 3:09 AM

## 2021-11-19 NOTE — Progress Notes (Signed)
Wasted 125 mL of fentanyl with Vito Berger, RN into stericycle bin.

## 2021-11-20 ENCOUNTER — Inpatient Hospital Stay (HOSPITAL_COMMUNITY): Payer: Medicare Other

## 2021-11-20 DIAGNOSIS — A4151 Sepsis due to Escherichia coli [E. coli]: Secondary | ICD-10-CM | POA: Diagnosis not present

## 2021-11-20 DIAGNOSIS — R6520 Severe sepsis without septic shock: Secondary | ICD-10-CM | POA: Diagnosis not present

## 2021-11-20 DIAGNOSIS — J9601 Acute respiratory failure with hypoxia: Secondary | ICD-10-CM | POA: Diagnosis not present

## 2021-11-20 DIAGNOSIS — J690 Pneumonitis due to inhalation of food and vomit: Secondary | ICD-10-CM | POA: Diagnosis not present

## 2021-11-20 LAB — BASIC METABOLIC PANEL
Anion gap: 6 (ref 5–15)
BUN: 18 mg/dL (ref 6–20)
CO2: 23 mmol/L (ref 22–32)
Calcium: 7.8 mg/dL — ABNORMAL LOW (ref 8.9–10.3)
Chloride: 113 mmol/L — ABNORMAL HIGH (ref 98–111)
Creatinine, Ser: 0.48 mg/dL — ABNORMAL LOW (ref 0.61–1.24)
GFR, Estimated: >60 mL/min (ref >60)
Glucose, Bld: 119 mg/dL — ABNORMAL HIGH (ref 70–99)
Potassium: 3.8 mmol/L (ref 3.5–5.1)
Sodium: 142 mmol/L (ref 135–145)

## 2021-11-20 LAB — PHOSPHORUS: Phosphorus: 3.6 mg/dL (ref 2.5–4.6)

## 2021-11-20 LAB — GLUCOSE, CAPILLARY
Glucose-Capillary: 100 mg/dL — ABNORMAL HIGH (ref 70–99)
Glucose-Capillary: 101 mg/dL — ABNORMAL HIGH (ref 70–99)
Glucose-Capillary: 103 mg/dL — ABNORMAL HIGH (ref 70–99)
Glucose-Capillary: 106 mg/dL — ABNORMAL HIGH (ref 70–99)
Glucose-Capillary: 112 mg/dL — ABNORMAL HIGH (ref 70–99)

## 2021-11-20 LAB — CBC
HCT: 29 % — ABNORMAL LOW (ref 39.0–52.0)
Hemoglobin: 9.3 g/dL — ABNORMAL LOW (ref 13.0–17.0)
MCH: 31.1 pg (ref 26.0–34.0)
MCHC: 32.1 g/dL (ref 30.0–36.0)
MCV: 97 fL (ref 80.0–100.0)
Platelets: 365 10*3/uL (ref 150–400)
RBC: 2.99 MIL/uL — ABNORMAL LOW (ref 4.22–5.81)
RDW: 13.6 % (ref 11.5–15.5)
WBC: 6.7 10*3/uL (ref 4.0–10.5)
nRBC: 0 % (ref 0.0–0.2)

## 2021-11-20 LAB — MAGNESIUM: Magnesium: 2 mg/dL (ref 1.7–2.4)

## 2021-11-20 MED ORDER — FREE WATER
150.0000 mL | Status: DC
  Administered 2021-11-20 – 2021-11-23 (×19): 150 mL

## 2021-11-20 MED ORDER — DOCUSATE SODIUM 50 MG/5ML PO LIQD: 100.0000 mg | Freq: Two times a day (BID) | ORAL | Status: DC | PRN

## 2021-11-20 MED ORDER — SODIUM BICARBONATE 650 MG PO TABS
650.0000 mg | ORAL_TABLET | Freq: Once | ORAL | Status: AC
  Administered 2021-11-20: 650 mg
  Filled 2021-11-20: qty 1

## 2021-11-20 MED ORDER — PANCRELIPASE (LIP-PROT-AMYL) 10440-39150 UNITS PO TABS
20880.0000 [IU] | ORAL_TABLET | Freq: Once | ORAL | Status: AC
Start: 2021-11-20 — End: 2021-11-20
  Administered 2021-11-20: 20880 [IU]
  Filled 2021-11-20: qty 2

## 2021-11-20 MED ORDER — POLYETHYLENE GLYCOL 3350 17 G PO PACK: 17.0000 g | PACK | Freq: Every day | ORAL | Status: DC | PRN

## 2021-11-20 NOTE — Plan of Care (Signed)
  Problem: Education: Goal: Knowledge of General Education information will improve Description: Including pain rating scale, medication(s)/side effects and non-pharmacologic comfort measures Outcome: Progressing   Problem: Health Behavior/Discharge Planning: Goal: Ability to manage health-related needs will improve Outcome: Progressing   Problem: Clinical Measurements: Goal: Ability to maintain clinical measurements within normal limits will improve Outcome: Progressing Goal: Will remain free from infection Outcome: Progressing Goal: Diagnostic test results will improve Outcome: Progressing Goal: Respiratory complications will improve Outcome: Progressing Goal: Cardiovascular complication will be avoided Outcome: Progressing   Problem: Activity: Goal: Risk for activity intolerance will decrease Outcome: Progressing   Problem: Nutrition: Goal: Adequate nutrition will be maintained Outcome: Progressing   Problem: Coping: Goal: Level of anxiety will decrease Outcome: Progressing   Problem: Elimination: Goal: Will not experience complications related to bowel motility Outcome: Progressing Goal: Will not experience complications related to urinary retention Outcome: Progressing   Problem: Pain Managment: Goal: General experience of comfort will improve Outcome: Progressing   Problem: Safety: Goal: Ability to remain free from injury will improve Outcome: Progressing   Problem: Skin Integrity: Goal: Risk for impaired skin integrity will decrease Outcome: Progressing   Problem: Safety: Goal: Non-violent Restraint(s) Outcome: Progressing   Problem: Activity: Goal: Ability to tolerate increased activity will improve Outcome: Progressing   Problem: Respiratory: Goal: Ability to maintain a clear airway and adequate ventilation will improve Outcome: Progressing   Problem: Role Relationship: Goal: Method of communication will improve Outcome: Progressing   

## 2021-11-20 NOTE — Progress Notes (Signed)
NAME:  Todd Snyder, MRN:  [id number], DOB:  November 29, 1990, LOS: 14 ADMISSION DATE:  10/31/2021,  CHIEF COMPLAINT:  Dyspnea, weakness  History of Present Illness:  56M with a hx of autism. Was well until 2 days prior to admission when he was noted to have nausea, vomiting, poor PO intake. Experienced progressive weakness, tachypnea, tachycardia. EMS was call and he was found in respiratory distress.  In ED required intubation and MV. Eval revealed LLL PNA, acute renal failure. Also question ileus vs bowel obstruction. Transfers to [facility] with respiratory failure, septic shock and acute renal failure.   Pertinent  Medical History   Past Medical History:  Diagnosis Date   Autism    Bilateral club feet     Significant Hospital Events: Including procedures, antibiotic start and stop dates in addition to other pertinent events   8/13 admitted, intubated, bronched.  8/14 clinically no ileus or SBO, no NGT output, so TF started 8/19 CXR not better despite CPT & nebs. CT shows posterior predominant loculated effusion. Still not amenable to bedside placement due to placement of effusion. IR consulted for CT-guided chest tube. 8/23 extubated, continues to have secretions 8/25 copious secretions overnight, hypoxia, intubated. Vomited post intubation   Interim History / Subjective:  No events overnight.   Objective   Blood pressure 127/63, pulse 88, temperature 98.2 F (36.8 C), temperature source Axillary, resp. rate 16, height 5\' 6"  (1.676 m), weight 70.9 kg, SpO2 100 %.    Vent Mode: PRVC FiO2 (%):  [40 %] 40 % Set Rate:  [16 bmp] 16 bmp Vt Set:  [510 mL] 510 mL PEEP:  [5 cmH20] 5 cmH20 Pressure Support:  [15 cmH20] 15 cmH20 Plateau Pressure:  [17 cmH20-19 cmH20] 19 cmH20   Intake/Output Summary (Last 24 hours) at 11/20/2021 0740 Last data filed at 11/20/2021 0600 Gross per 24 hour  Intake 2289.17 ml  Output 1680 ml  Net 609.17 ml   Filed Weights   11/18/21 0500 11/19/21 0617  11/20/21 0419  Weight: 72.1 kg 73 kg 70.9 kg    Examination: General:  chronically ill appearing man lying in bed on vent  HEENT: ETT/OG in place  Neuro: alert, tracks examiner, does not follow commands  CV: RRR, HR 94, no mRG PULM:  diminished breath sounds to left lung, no use of accessory muscles  GI: soft, non-distended, active bowel sounds  Extremities: - edema, no cyanosis Skin: no rashes, skin warm & dry   Resolved Hospital Problem list   Transaminase elevation AKI Hypernatremia Hypokalemia Hypophosphatemia Thrombocytopenia Septic shock AKI Supraventricular arrhythmia with tachycardia 2/2 sepsis gastroenteritis  Assessment & Plan:   Acute respiratory failure LLL E Coli Pneumonia, complicated by loculated pleural effusion posteriorly and pneumothorax Patient extubated 8/23 with tachypnea and shallow breathing but able to maintain sats. He required re-intubated overnight 8/25 due to copious secretions and dropping O2 sat to 60's. Vomited post-intubation. Repeat CXR showed stable aeration and known necrotic portion and stable effusion. On CTX which will cover for aspiration. Chest tube removed 8/24 Plan - continue CTX with completion of course 8/30 - mechanical ventilation and wean as able. (Patient has profoundly weak muscles, weaning may be difficult. Patient may require trach for long term ventilation.)  - Daily SBT  - Continue scheduled CPT and Bronchodilators   Dysphagia SLP evaluated the patient and recommended NPO. Patient has had recurrent vomiting and complications from aspiration. He is unable to clear his secretions due to weak gag/cough reflex.  Plan  -  may benefit from PEG placement. - Continue TF. Tolerating at this time  - ensure adequate suction of oral secretions - banatrol - Continues to have diarrhea. FMS in place. - Bowel regimen as needed   AFIB with RVR Induced by respiratory distress. Did not resolve post-intubation. Placed on amio drip.  Patient does not appear to be in pain. RVR resolved. Similar episode early in admission. Has same risk of embolism as paroxsymal afib, will need AC Plan - continuous cardiac monitoring  - continue metop 12.5 TID for rate control - will need to be anticoagulated  Acute septic encephalopathy- improving Hx of ataxic cerebral palsy and autism:  Baseline nonverbal; communicates with hand signals. Can mostly feed himself (spoon more easily than fork), can dress himself with minimal assistance.  Plan - PRN Fentanyl/Versed for RASS goal 0/-1 - Anticipate a prolonged recovery  At risk for malnutrition - Continue postpyloric feeding via core track at full rate  Hypernatremia - decrease free water  to 150cc q4h - trend BMP  Urinary retention  Plan -Continue doxazosin (started 8/26)   Left bacterial vs. Viral conjunctivitis - con't antibiotic eye drops  Best Practice (right click and "Reselect all SmartList Selections" daily)   Diet/type: tubefeeds DVT prophylaxis: prophylactic heparin  GI prophylaxis: PPI Code Status:  full code Last date of multidisciplinary goals of care discussion: ongoing conversations.    CRITICAL CARE Performed by: Tobey Grim   Total critical care time: 32 minutes  Critical care time was exclusive of separately billable procedures and treating other patients.  Critical care was necessary to treat or prevent imminent or life-threatening deterioration.  Critical care was time spent personally by me on the following activities: development of treatment plan with patient and/or surrogate as well as nursing, discussions with consultants, evaluation of patient's response to treatment, examination of patient, obtaining history from patient or surrogate, ordering and performing treatments and interventions, ordering and review of laboratory studies, ordering and review of radiographic studies, pulse oximetry and re-evaluation of patient's condition.

## 2021-11-20 NOTE — Progress Notes (Addendum)
eLink Physician-Brief Progress Note Patient Name: Todd Snyder DOB: Jul 07, 1990 MRN: [id number]   Date of Service  11/20/2021  HPI/Events of Note  Notified as pt has a persistent air leak on the vent.  RT had tried overinflating the cuff, which would work temporarily. The cuff apparently would not hold air.   eICU Interventions  Notified ground team to exchange ETT.     4:15 AM Update: ETT advanced 1-2cm by RT with resolution of issue.  Will follow up CXR.     Jerek Meulemans M DELA CRUZ 11/20/2021, 3:59 AM

## 2021-11-21 DIAGNOSIS — A419 Sepsis, unspecified organism: Secondary | ICD-10-CM | POA: Diagnosis not present

## 2021-11-21 DIAGNOSIS — N179 Acute kidney failure, unspecified: Secondary | ICD-10-CM | POA: Diagnosis not present

## 2021-11-21 DIAGNOSIS — K567 Ileus, unspecified: Secondary | ICD-10-CM | POA: Diagnosis not present

## 2021-11-21 DIAGNOSIS — J189 Pneumonia, unspecified organism: Secondary | ICD-10-CM | POA: Diagnosis not present

## 2021-11-21 LAB — GLUCOSE, CAPILLARY
Glucose-Capillary: 108 mg/dL — ABNORMAL HIGH (ref 70–99)
Glucose-Capillary: 115 mg/dL — ABNORMAL HIGH (ref 70–99)
Glucose-Capillary: 105 mg/dL — ABNORMAL HIGH (ref 70–99)
Glucose-Capillary: 94 mg/dL (ref 70–99)
Glucose-Capillary: 101 mg/dL — ABNORMAL HIGH (ref 70–99)
Glucose-Capillary: 98 mg/dL (ref 70–99)

## 2021-11-21 LAB — BASIC METABOLIC PANEL
Anion gap: 6 (ref 5–15)
BUN: 16 mg/dL (ref 6–20)
CO2: 23 mmol/L (ref 22–32)
Calcium: 7.7 mg/dL — ABNORMAL LOW (ref 8.9–10.3)
Chloride: 114 mmol/L — ABNORMAL HIGH (ref 98–111)
Creatinine, Ser: 0.44 mg/dL — ABNORMAL LOW (ref 0.61–1.24)
GFR, Estimated: >60 mL/min (ref >60)
Glucose, Bld: 119 mg/dL — ABNORMAL HIGH (ref 70–99)
Potassium: 3.8 mmol/L (ref 3.5–5.1)
Sodium: 143 mmol/L (ref 135–145)

## 2021-11-21 LAB — CBC
HCT: 29.5 % — ABNORMAL LOW (ref 39.0–52.0)
Hemoglobin: 9.5 g/dL — ABNORMAL LOW (ref 13.0–17.0)
MCH: 31 pg (ref 26.0–34.0)
MCHC: 32.2 g/dL (ref 30.0–36.0)
MCV: 96.4 fL (ref 80.0–100.0)
Platelets: 290 10*3/uL (ref 150–400)
RBC: 3.06 MIL/uL — ABNORMAL LOW (ref 4.22–5.81)
RDW: 13.4 % (ref 11.5–15.5)
WBC: 5.8 10*3/uL (ref 4.0–10.5)
nRBC: 0 % (ref 0.0–0.2)

## 2021-11-21 LAB — MAGNESIUM: Magnesium: 1.8 mg/dL (ref 1.7–2.4)

## 2021-11-21 LAB — PHOSPHORUS: Phosphorus: 3.4 mg/dL (ref 2.5–4.6)

## 2021-11-21 MED ORDER — ORAL CARE MOUTH RINSE: 15.0000 mL | OROMUCOSAL | Status: DC

## 2021-11-21 MED ORDER — POTASSIUM CHLORIDE 20 MEQ PO PACK
20.0000 meq | PACK | Freq: Once | ORAL | Status: AC
  Administered 2021-11-21: 20 meq
  Filled 2021-11-21: qty 1

## 2021-11-21 MED ORDER — ORAL CARE MOUTH RINSE
15.0000 mL | OROMUCOSAL | Status: DC
  Administered 2021-11-21 – 2021-11-28 (×27): 15 mL via OROMUCOSAL

## 2021-11-21 MED ORDER — ORAL CARE MOUTH RINSE: 15.0000 mL | OROMUCOSAL | Status: DC | PRN

## 2021-11-21 MED ORDER — MAGNESIUM SULFATE 2 GM/50ML IV SOLN
2.0000 g | Freq: Once | INTRAVENOUS | Status: AC
  Administered 2021-11-21: 2 g via INTRAVENOUS
  Filled 2021-11-21: qty 50

## 2021-11-21 NOTE — Procedures (Signed)
Extubation Procedure Note  Patient Details:   Name: TAYSHAUN KROH DOB: 11/27/1990 MRN: [id number]   Airway Documentation:    Vent end date: 11/21/21 Vent end time: 1345   Evaluation  O2 sats: stable throughout Complications: No apparent complications Patient did tolerate procedure well. Bilateral Breath Sounds: Rhonchi   No  Toula Moos 11/21/2021, 1:51 PM

## 2021-11-21 NOTE — Progress Notes (Signed)
SLP Cancellation Note  Patient Details Name: Todd Snyder MRN: [id number] DOB: Sep 10, 1990   Cancelled treatment:        Pt is on ventilator. He will need instrumental assessment of swallow when appropriate.Will follow.    Royce Macadamia 11/21/2021, 8:21 AM

## 2021-11-21 NOTE — Progress Notes (Signed)
Pt extubated to nasal cannula. Father expressed verbally that he does not want pt reintubated. States he aware of possible outcome.

## 2021-11-21 NOTE — Progress Notes (Signed)
Pharmacy Electrolyte Replacement  Recent Labs:  Recent Labs    11/21/21 0446  K 3.8  MG 1.8  PHOS 3.4  CREATININE 0.44*    Low Critical Values (K </= 2.5, Phos </= 1, Mg </= 1) Present: None  MD Contacted: N/A  Plan: Replete K+ with packet per tube

## 2021-11-21 NOTE — IPAL (Signed)
Interdisciplinary Goals of Care Family Meeting   Date carried out:: 11/21/2021  Location of the meeting: Bedside  Member's involved: Physician, Bedside Registered Nurse, and Family Member or next of kin  Durable Power of Attorney or acting medical decision maker: Irving Shows    Discussion: We discussed goals of care for Mal Misty .    The Clinical status was relayed to patient's father at bedside  in detail.   Updated and notified of patients medical condition.   Patient is having a weak cough and struggling to remove secretions.   Patient with increased WOB and using accessory muscles to breathe Explained to family course of therapy and the modalities   Patient's father stated he does not want tracheostomy PEG tube placement, suggested that we will proceed with one-way extubation, if he struggles then will proceed with comfort care   Code status: Full DNR  Disposition: Continue current acute care    Family are satisfied with Plan of action and management. All questions answered   Cheri Fowler MD [facility] See Amion for pager If no response to pager, please call [phone] until 7pm After 7pm, Please call E-link [phone]

## 2021-11-21 NOTE — Progress Notes (Signed)
Father refused removal of restraints. Father is concerned over event 10 years ago when pt pulled a feeding tube and subsequently aspirated few days later. Father states "I know he will go for that tube".

## 2021-11-21 NOTE — Progress Notes (Signed)
[facility] ADULT ICU REPLACEMENT PROTOCOL   The patient does apply for the [facility] Adult ICU Electrolyte Replacment Protocol based on the criteria listed below:   1.Exclusion criteria: TCTS patients, ECMO patients, and Dialysis patients 2. Is GFR >/= 30 ml/min? Yes.    Patient's GFR today is >60 3. Is SCr </= 2? Yes.   Patient's SCr is 0.44 mg/dL 4. Did SCr increase >/= 0.5 in 24 hours? No. 5.Pt's weight >40kg  Yes.   6. Abnormal electrolyte(s): Mag  7. Electrolytes replaced per protocol 8.  Call MD STAT for K+ </= 2.5, Phos </= 1, or Mag </= 1 Physician:  Forbes Cellar [facility] 11/21/2021 5:37 AM

## 2021-11-21 NOTE — Progress Notes (Signed)
NAME:  Todd Snyder, MRN:  [id number], DOB:  07-Dec-1990, LOS: 15 ADMISSION DATE:  Nov 22, 2021, CONSULTATION DATE:  November 22, 2021 REFERRING MD:  ED, CHIEF COMPLAINT:  dyspnea, weakness   History of Present Illness:  10M with a hx of autism. Was well until 2 days prior to admission when he was noted to have nausea, vomiting, poor PO intake. Experienced progressive weakness, tachypnea, tachycardia. EMS was call and he was found in respiratory distress.  In ED required intubation and MV. Eval revealed LLL PNA, acute renal failure. Also question ileus vs bowel obstruction. Transfers to [facility] with respiratory failure, septic shock and acute renal failure.   Pertinent  Medical History  Autism, bilateral club feet  Significant Hospital Events: Including procedures, antibiotic start and stop dates in addition to other pertinent events   8/13 admitted, intubated, bronched.  8/14 clinically no ileus or SBO, no NGT output, so TF started 8/19 CXR not better despite CPT & nebs. CT shows posterior predominant loculated effusion. Still not amenable to bedside placement due to placement of effusion. IR consulted for CT-guided chest tube. 8/23 extubated, continues to have secretions 8/24 chest tube removed 8/25 copious secretions overnight, hypoxia, intubated. Vomited post intubation   Interim History / Subjective:  No acute events overnight.  Objective   Blood pressure (!) 119/58, pulse (!) 101, temperature 98.9 F (37.2 C), temperature source Axillary, resp. rate 17, height 5\' 6"  (1.676 m), weight 70.6 kg, SpO2 100 %.    Vent Mode: PRVC FiO2 (%):  [30 %-40 %] 30 % Set Rate:  [16 bmp] 16 bmp Vt Set:  [510 mL] 510 mL PEEP:  [5 cmH20] 5 cmH20 Pressure Support:  [12 cmH20] 12 cmH20 Plateau Pressure:  [16 cmH20-17 cmH20] 17 cmH20   Intake/Output Summary (Last 24 hours) at 11/21/2021 0702 Last data filed at 11/21/2021 0600 Gross per 24 hour  Intake 1984.14 ml  Output 1225 ml  Net 759.14 ml   Filed  Weights   11/19/21 0617 11/20/21 0419 11/21/21 0114  Weight: 73 kg 70.9 kg 70.6 kg    Examination: General: Intubated, sedated, acutely ill appearing HENT: ETT to vent Lungs: Mech vent sounds auscultated  Cardiovascular: Tachycardic rate, rhythm. No murmurs Abdomen: +BS, soft, nontender, non-distended. Extremities: No edema, bilateral club feet Neuro: Sedated, tracks, unable to follow commands GU: external cath in place  Resolved Hospital Problem list   Transaminase elevation AKI Hypernatremia Hypokalemia Hypophosphatemia Thrombocytopenia Septic shock AKI Supraventricular arrhythmia with tachycardia 2/2 sepsis gastroenteritis  Assessment & Plan:  Acute respiratory failure LLL E coli pneumonia, complicated by loculated pleural effusion and pneumothorax Patient initially intubated on 8/13, extubated on 8/23, however was tachypneic with shallow breathing. Required re-intubation on 8/25 due to copious secretions and O2 sats dropping to the 60s. Vomited post-intubation. CXR on 8/28 with improved aeration in LLL and improvement in small left pleural effusion. Vent setting switched from [facility] to CPAP this morning with PS 8, PEEP 5, and FiO2 30%.  - Continue ceftriaxone through 8/30  - Continue vent support - Ongoing conversations with family regarding one way extubation vs trach (have not wanted to pursue trach in the past) - Daily SBT - Continue scheduled CPT and bronchodilators  Dysphagia Patient has had recurrent vomiting and aspiration events. He is unable to clear his secretions. - PEG placement?  - Continue tube feeds as tolerated - FMS in place, patient continues to have diarrhea - Bowel regimen prn  Afib with RVR Inducted by respiratory distress and was placed on  amio drip. RVR has resolved. - Cardiac monitoring - Metoprolol 12.5 mg bid per tube - Will need anticoag in future  Acute encephalopathy, improving Hx of ataxic cerebral palsy and autism Nonverbal at  baseline, communicates with hand signals.  Patient tracks now, but unable to follow commands  - PRN fentanyl/versed for RASS goal 0/-1  At risk for malnutrition - Postpyloric feeding via coretrack   Hypernatremia, improved Sodium elevated to 149 and improved to 143 today.  - Continue free water 150 cc q4h   Urinary retention - Doxazosin   Left bacterial vs viral conjunctivitis - Cont abx eye drops  Best Practice (right click and "Reselect all SmartList Selections" daily)   Diet/type: tubefeeds DVT prophylaxis: prophylactic heparin  GI prophylaxis: PPI Lines: N/A Foley:  N/A Code Status:  full code Last date of multidisciplinary goals of care discussion [ongoing conversations]  Labs   CBC: Recent Labs  Lab 11/17/21 0032 11/18/21 0008 11/18/21 0303 11/19/21 0108 11/20/21 0622 11/21/21 0446  WBC 11.0* 10.5  --  8.9 6.7 5.8  HGB 10.1* 10.0* 10.2* 9.6* 9.3* 9.5*  HCT 31.8* 31.9* 30.0* 29.7* 29.0* 29.5*  MCV 96.7 98.5  --  96.4 97.0 96.4  PLT 434* 501*  --  385 365 290    Basic Metabolic Panel: Recent Labs  Lab 11/17/21 0032 11/18/21 0008 11/18/21 0303 11/19/21 0108 11/20/21 0622 11/21/21 0446  NA 147* 149* 148* 142 142 143  K 3.5 3.9 4.3 3.6 3.8 3.8  CL 117* 116*  --  110 113* 114*  CO2 25 26  --  26 23 23   GLUCOSE 119* 130*  --  104* 119* 119*  BUN 13 12  --  17 18 16   CREATININE 0.47* 0.59*  --  0.61 0.48* 0.44*  CALCIUM 8.0* 8.4*  --  8.1* 7.8* 7.7*  MG 2.0 2.0  --  1.9 2.0 1.8  PHOS 2.6 2.5  --  2.5 3.6 3.4   GFR: Estimated Creatinine Clearance: 118.5 mL/min (A) (by C-G formula based on SCr of 0.44 mg/dL (L)). Recent Labs  Lab 11/18/21 0008 11/19/21 0108 11/20/21 0622 11/21/21 0446  WBC 10.5 8.9 6.7 5.8    Liver Function Tests: No results for input(s): "AST", "ALT", "ALKPHOS", "BILITOT", "PROT", "ALBUMIN" in the last 168 hours. No results for input(s): "LIPASE", "AMYLASE" in the last 168 hours. No results for input(s): "AMMONIA" in the last  168 hours.  ABG    Component Value Date/Time   PHART 7.325 (L) 11/18/2021 0303   PCO2ART 54.5 (H) 11/18/2021 0303   PO2ART 77 (L) 11/18/2021 0303   HCO3 28.2 (H) 11/18/2021 0303   TCO2 30 11/18/2021 0303   ACIDBASEDEF 8.0 (H) 11/07/2021 1130   O2SAT 93 11/18/2021 0303     Coagulation Profile: No results for input(s): "INR", "PROTIME" in the last 168 hours.  Cardiac Enzymes: No results for input(s): "CKTOTAL", "CKMB", "CKMBINDEX", "TROPONINI" in the last 168 hours.  HbA1C: No results found for: "HGBA1C"  CBG: Recent Labs  Lab 11/20/21 0841 11/20/21 1306 11/20/21 1938 11/20/21 2333 11/21/21 0335  GLUCAP 101* 112* 103* 106* 108*    Review of Systems:   Unable to assess  Past Medical History:  He,  has a past medical history of Autism and Bilateral club feet.   Surgical History:  The histories are not reviewed yet. Please review them in the "History" navigator section and refresh this SmartLink.   Social History:      Family History:  His family history is  not on file.   Allergies Allergies  Allergen Reactions   Latex      Home Medications  Prior to Admission medications   Medication Sig Start Date End Date Taking? Authorizing Provider  loratadine (CLARITIN) 10 MG tablet Take 10 mg by mouth daily.   Yes [provider]     Critical care time: 15

## 2021-11-21 NOTE — Progress Notes (Signed)
PT Cancellation Note  Patient Details Name: Todd Snyder MRN: [id number] DOB: 1990/04/29   Cancelled Treatment:    Reason Eval/Treat Not Completed: Patient not medically ready.  Pt on vent, presently sedated and not doing well.  Hold per RN. 11/21/2021  Jacinto Halim., PT Acute Rehabilitation Services [phone]  (pager) [phone]  (office)   Eliseo Gum Zoriana Oats 11/21/2021, 12:27 PM

## 2021-11-22 DIAGNOSIS — R6521 Severe sepsis with septic shock: Secondary | ICD-10-CM | POA: Diagnosis not present

## 2021-11-22 DIAGNOSIS — J189 Pneumonia, unspecified organism: Secondary | ICD-10-CM | POA: Diagnosis not present

## 2021-11-22 DIAGNOSIS — N179 Acute kidney failure, unspecified: Secondary | ICD-10-CM | POA: Diagnosis not present

## 2021-11-22 DIAGNOSIS — A419 Sepsis, unspecified organism: Secondary | ICD-10-CM | POA: Diagnosis not present

## 2021-11-22 LAB — CBC
HCT: 31.4 % — ABNORMAL LOW (ref 39.0–52.0)
Hemoglobin: 10 g/dL — ABNORMAL LOW (ref 13.0–17.0)
MCH: 30.8 pg (ref 26.0–34.0)
MCHC: 31.8 g/dL (ref 30.0–36.0)
MCV: 96.6 fL (ref 80.0–100.0)
Platelets: 346 10*3/uL (ref 150–400)
RBC: 3.25 MIL/uL — ABNORMAL LOW (ref 4.22–5.81)
RDW: 13.3 % (ref 11.5–15.5)
WBC: 7.6 10*3/uL (ref 4.0–10.5)
nRBC: 0 % (ref 0.0–0.2)

## 2021-11-22 LAB — BASIC METABOLIC PANEL
Anion gap: 5 (ref 5–15)
BUN: 15 mg/dL (ref 6–20)
CO2: 26 mmol/L (ref 22–32)
Calcium: 8.2 mg/dL — ABNORMAL LOW (ref 8.9–10.3)
Chloride: 113 mmol/L — ABNORMAL HIGH (ref 98–111)
Creatinine, Ser: 0.47 mg/dL — ABNORMAL LOW (ref 0.61–1.24)
GFR, Estimated: >60 mL/min (ref >60)
Glucose, Bld: 101 mg/dL — ABNORMAL HIGH (ref 70–99)
Potassium: 3.8 mmol/L (ref 3.5–5.1)
Sodium: 144 mmol/L (ref 135–145)

## 2021-11-22 LAB — PHOSPHORUS: Phosphorus: 3.1 mg/dL (ref 2.5–4.6)

## 2021-11-22 LAB — GLUCOSE, CAPILLARY
Glucose-Capillary: 101 mg/dL — ABNORMAL HIGH (ref 70–99)
Glucose-Capillary: 105 mg/dL — ABNORMAL HIGH (ref 70–99)
Glucose-Capillary: 108 mg/dL — ABNORMAL HIGH (ref 70–99)
Glucose-Capillary: 96 mg/dL (ref 70–99)
Glucose-Capillary: 100 mg/dL — ABNORMAL HIGH (ref 70–99)

## 2021-11-22 LAB — MAGNESIUM: Magnesium: 2 mg/dL (ref 1.7–2.4)

## 2021-11-22 MED ORDER — METOPROLOL TARTRATE 25 MG PO TABS
25.0000 mg | ORAL_TABLET | Freq: Two times a day (BID) | ORAL | Status: DC
  Administered 2021-11-22 – 2021-11-28 (×12): 25 mg
  Filled 2021-11-22 (×12): qty 1

## 2021-11-22 MED ORDER — BANATROL TF EN LIQD
60.0000 mL | Freq: Three times a day (TID) | ENTERAL | Status: DC
  Administered 2021-11-22 – 2021-11-28 (×16): 60 mL
  Filled 2021-11-22 (×22): qty 60

## 2021-11-22 MED ORDER — LOPERAMIDE HCL 2 MG PO CAPS
2.0000 mg | ORAL_CAPSULE | ORAL | Status: DC | PRN
Start: 2021-11-22 — End: 2021-11-30

## 2021-11-22 NOTE — Progress Notes (Signed)
Speech Language Pathology Treatment: Dysphagia  Patient Details Name: Todd Snyder MRN: [id number] DOB: [birth year] Today's Date: 11/22/2021 Time: 5035-4656 SLP Time Calculation (min) (ACUTE ONLY): 20 min  Assessment / Plan / Recommendation Clinical Impression  Patient seen by SLP for skilled treatment focused on dysphagia goals and to determine if ready for objective swallow study. His father was present in the room. SLP and patient's father discussed patient's potential ability to tolerate swallow assessments. He likely would not tolerate FEES. He may tolerate an MBS and his father said when he is sick like he currently is, he is more tolerant of things. He eats a wide variety of foods so he might tolerate barium contrast, perhaps mixed with something. SLP presented single ice chip to his mouth and he slowly accepted it but father then assisted and gently pushed it into anterior portion of oral cavity. Patient exhibited light contacts with oral motor musculature and did very lightly "suck" on ice chip. Laryngeal pumping observed but no true swallow. Patient had delayed cough response after both of the two ice chips that were given. SLP is recommending to continue NPO status and PO trials at bedside to hopefully get him ready for MBS.    HPI HPI: 31 year old man who remains critically ill due to acute hypoxic respiratory failure secondary to likely aspiration pneumonia after suspected aspiration of emesis. Prolonged intubation (8/13-8/23). 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 PMH includes aspiration PNA.      SLP Plan  Continue with current plan of care      Recommendations for follow up therapy are one component of a multi-disciplinary discharge planning process, led by the attending physician.  Recommendations may be updated based on patient status, additional functional criteria and insurance authorization.    Recommendations  Diet recommendations: NPO Medication  Administration: Via alternative means                Oral Care Recommendations: Oral care QID;Staff/trained caregiver to provide oral care Follow Up Recommendations: Other (comment) (TBD pending progress) Assistance recommended at discharge: Frequent or constant Supervision/Assistance SLP Visit Diagnosis: Dysphagia, unspecified (R13.10) Plan: Continue with current plan of care          Angela Nevin, MA, CCC-SLP Speech Therapy

## 2021-11-22 NOTE — Progress Notes (Signed)
Unable to complete CPT at this time as pt is sitting up in chair

## 2021-11-22 NOTE — Progress Notes (Signed)
NAME:  Todd Snyder, MRN:  [id number], DOB:  1990-11-21, LOS: 29 ADMISSION DATE:  11/07/2021, CONSULTATION DATE:  8/13 REFERRING MD:  ED, CHIEF COMPLAINT:  dyspnea, weakness   History of Present Illness:  85M with a hx of autism. Was well until 2 days prior to admission when he was noted to have nausea, vomiting, poor PO intake. Experienced progressive weakness, tachypnea, tachycardia. EMS was call and he was found in respiratory distress.  In ED required intubation and MV. Eval revealed LLL PNA, acute renal failure. Also question ileus vs bowel obstruction. Transfers to [facility] with respiratory failure, septic shock and acute renal failure.   Pertinent  Medical History  Autism, bilateral club feet  Significant Hospital Events: Including procedures, antibiotic start and stop dates in addition to other pertinent events   8/13 admitted, intubated, bronched.  8/14 clinically no ileus or SBO, no NGT output, so TF started 8/19 CXR not better despite CPT & nebs. CT shows posterior predominant loculated effusion. Still not amenable to bedside placement due to placement of effusion. IR consulted for CT-guided chest tube. 8/23 extubated, continues to have secretions 8/24 chest tube removed 8/25 copious secretions overnight, hypoxia, intubated. Vomited post intubation  8/28 extubated, continues to have secretions  Interim History / Subjective:  Patient continued to have copious secretions overnight, sats remained >90%.   Objective   Blood pressure 126/63, pulse (!) 112, temperature 99.3 F (37.4 C), temperature source Axillary, resp. rate (!) 27, height 5\' 6"  (1.676 m), weight 70.6 kg, SpO2 96 %.    Vent Mode: CPAP;PSV FiO2 (%):  [30 %] 30 % PEEP:  [5 cmH20] 5 cmH20 Pressure Support:  [8 cmH20] 8 cmH20   Intake/Output Summary (Last 24 hours) at 11/22/2021 [id number] Last data filed at 11/22/2021 0600 Gross per 24 hour  Intake 2415.96 ml  Output 2325 ml  Net 90.96 ml   Filed Weights   11/19/21  0617 11/20/21 0419 11/21/21 0114  Weight: 73 kg 70.9 kg 70.6 kg    Examination: General: Critically ill appearing male. HENT: [place]/AT, eyes anicteric.  Lungs: Rhonchi in upper airway, normal work of breathing on [place] Cardiovascular: Tachycardic rate, regular rhythm. No murmurs Abdomen: Soft, nontender, nondistended, +BS Extremities: Bilateral club feet Neuro: Opens eyes, but does not follow commands   Resolved Hospital Problem list     Assessment & Plan:  Acute respiratory failure LLL E coli pneumonia, complicated by loculated pleural effusion and pneumothorax Patient initially intubated on 8/13, extubated on 8/23, however was tachypneic with shallow breathing. Required re-intubation on 8/25 due to copious secretions and O2 sats dropping to the 60s. Vomited post-intubation. CXR on 8/28 with improved aeration in LLL and improvement in small left pleural effusion. Patient extubated on 8/28- one way extubation- if no improvement in respiratory status, will proceed with comfort measures.  - Continue ceftriaxone through 8/30  - Continue scheduled CPT and bronchodilators   Dysphagia Patient has had recurrent vomiting and aspiration events. He is unable to clear his secretions. Patient's father does not wish to pursue PEG placement.  - Continue tube feeds as tolerated  Afib with RVR Inducted by respiratory distress and was placed on amio drip. RVR has resolved and patient remains in sinus tachycardia.  - Cardiac monitoring - Increase metoprolol to 25 mg bid per tube - Will need anticoag in future   Acute encephalopathy, improving Hx of ataxic cerebral palsy and autism Nonverbal at baseline, communicates with hand signals.  Patient tracks now, but unable to follow  commands  - PRN fentanyl/versed for RASS goal 0/-1   At risk for malnutrition - Postpyloric feeding via coretrack   Diarrhea Patient had ~ 1 L of stool output.  - Start prn immodium - Fiber supplements bid per tube - FMS  in place, patient continues to have diarrhea   Hypernatremia, improved Sodium elevated to 149 and improved to 144 today.  - Continue free water 150 cc q4h    Urinary retention - Doxazosin    Left bacterial vs viral conjunctivitis - Cont abx eye drops  Best Practice (right click and "Reselect all SmartList Selections" daily)   Diet/type: tubefeeds DVT prophylaxis: prophylactic heparin  GI prophylaxis: PPI Lines: N/A Foley:  N/A Code Status:  DNR/DNI Last date of multidisciplinary goals of care discussion [updated family at bedside 8/28, will continue with daily updates]  Stable to transfer patient to progressive floor  Labs   CBC: Recent Labs  Lab 11/18/21 0008 11/18/21 0303 11/19/21 0108 11/20/21 0622 11/21/21 0446 11/22/21 0027  WBC 10.5  --  8.9 6.7 5.8 7.6  HGB 10.0* 10.2* 9.6* 9.3* 9.5* 10.0*  HCT 31.9* 30.0* 29.7* 29.0* 29.5* 31.4*  MCV 98.5  --  96.4 97.0 96.4 96.6  PLT 501*  --  385 365 290 346    Basic Metabolic Panel: Recent Labs  Lab 11/18/21 0008 11/18/21 0303 11/19/21 0108 11/20/21 0622 11/21/21 0446 11/22/21 0027  NA 149* 148* 142 142 143 144  K 3.9 4.3 3.6 3.8 3.8 3.8  CL 116*  --  110 113* 114* 113*  CO2 26  --  26 23 23 26   GLUCOSE 130*  --  104* 119* 119* 101*  BUN 12  --  17 18 16 15   CREATININE 0.59*  --  0.61 0.48* 0.44* 0.47*  CALCIUM 8.4*  --  8.1* 7.8* 7.7* 8.2*  MG 2.0  --  1.9 2.0 1.8 2.0  PHOS 2.5  --  2.5 3.6 3.4 3.1   GFR: Estimated Creatinine Clearance: 118.5 mL/min (A) (by C-G formula based on SCr of 0.47 mg/dL (L)). Recent Labs  Lab 11/19/21 0108 11/20/21 0622 11/21/21 0446 11/22/21 0027  WBC 8.9 6.7 5.8 7.6    Liver Function Tests: No results for input(s): "AST", "ALT", "ALKPHOS", "BILITOT", "PROT", "ALBUMIN" in the last 168 hours. No results for input(s): "LIPASE", "AMYLASE" in the last 168 hours. No results for input(s): "AMMONIA" in the last 168 hours.  ABG    Component Value Date/Time   PHART 7.325 (L)  11/18/2021 0303   PCO2ART 54.5 (H) 11/18/2021 0303   PO2ART 77 (L) 11/18/2021 0303   HCO3 28.2 (H) 11/18/2021 0303   TCO2 30 11/18/2021 0303   ACIDBASEDEF 8.0 (H) 11/07/2021 1130   O2SAT 93 11/18/2021 0303     Coagulation Profile: No results for input(s): "INR", "PROTIME" in the last 168 hours.  Cardiac Enzymes: No results for input(s): "CKTOTAL", "CKMB", "CKMBINDEX", "TROPONINI" in the last 168 hours.  HbA1C: No results found for: "HGBA1C"  CBG: Recent Labs  Lab 11/21/21 1136 11/21/21 1533 11/21/21 1958 11/21/21 2314 11/22/21 0326  GLUCAP 105* 94 101* 98 101*      Past Medical History:  He,  has a past medical history of Autism and Bilateral club feet.   Surgical History:   The histories are not reviewed yet. Please review them in the "History" navigator section and refresh this SmartLink.    Family History:  His family history is not on file.   Allergies Allergies  Allergen Reactions  Latex      Home Medications  Prior to Admission medications   Medication Sig Start Date End Date Taking? Authorizing Provider  loratadine (CLARITIN) 10 MG tablet Take 10 mg by mouth daily.   Yes [provider]     Critical care time: 25 mins

## 2021-11-22 NOTE — Progress Notes (Signed)
Physical Therapy Treatment Patient Details Name: Todd Snyder MRN: [id number] DOB: Aug 17, 1990 Today's Date: 11/22/2021   History of Present Illness Pt is a 31 y/o male presenting as a transfer from [facility]/13 with septic shock due to LLL PNA. Pt intubated 8/13-8/23, Chest tube 8/19-8/24, reintubated 8/25-8/28.  PMHx: Autism and Bilateral club feet    PT Comments    Since last treatment, pt required reintubation and now extubated again.  Pt requiring max A transfers to EOB and pivot to chair.  Required multimodal cues for transfers and having pt's Father present helpful for cues/commands.  Pt tolerated OOB well with VSS. Positioned in chair with multiple pillows to support.     Recommendations for follow up therapy are one component of a multi-disciplinary discharge planning process, led by the attending physician.  Recommendations may be updated based on patient status, additional functional criteria and insurance authorization.  Follow Up Recommendations  Skilled nursing-short term rehab (<3 hours/day) Can patient physically be transported by private vehicle: No   Assistance Recommended at Discharge Frequent or constant Supervision/Assistance  Patient can return home with the following Assistance with cooking/housework;Direct supervision/assist for medications management;Direct supervision/assist for financial management;Assist for transportation;Help with stairs or ramp for entrance;A lot of help with walking and/or transfers;A lot of help with bathing/dressing/bathroom   Equipment Recommendations  Other (comment);Wheelchair (measurements PT);Wheelchair cushion (measurements PT);BSC/3in1 (further assessment post acute)    Recommendations for Other Services       Precautions / Restrictions Precautions Precautions: Fall     Mobility  Bed Mobility Overal bed mobility: Needs Assistance Bed Mobility: Supine to Sit     Supine to sit: Max assist     General bed mobility comments:  Requiring assist for legs off bed and to raise trunk    Transfers Overall transfer level: Needs assistance   Transfers: Bed to chair/wheelchair/BSC, Sit to/from Stand Sit to Stand: Max assist, +2 safety/equipment Stand pivot transfers: Mod assist, +2 safety/equipment         General transfer comment: Pt stood x 2 - did well holding Father's hands to initiate and max A from therapist to stand.  Pivoted to chair max A.  Did don pt's shoes prior to transfers    Ambulation/Gait               General Gait Details: unable   Stairs             Wheelchair Mobility    Modified Rankin (Stroke Patients Only)       Balance Overall balance assessment: Needs assistance Sitting-balance support: No upper extremity supported, Feet supported, Bilateral upper extremity supported Sitting balance-Leahy Scale: Poor Sitting balance - Comments: EOB for 3-5 mins with min-max A varying; poor trunk control     Standing balance-Leahy Scale: Zero Standing balance comment: Max A, standing briefly for pivot and straightening pad                            Cognition Arousal/Alertness: Awake/alert Behavior During Therapy: Flat affect, WFL for tasks assessed/performed Overall Cognitive Status: History of cognitive impairments - at baseline                                 General Comments: Pt non-verbal during session.  Did wave at therapist and followed some simple commands with cues.  Father present and helpful  Exercises General Exercises - Upper Extremity Shoulder Flexion: AAROM, Both, 5 reps, Seated General Exercises - Lower Extremity Long Arc Quad: AROM, Both, 5 reps, Seated (visual cues)    General Comments General comments (skin integrity, edema, etc.): VSS in all positions      Pertinent Vitals/Pain Pain Assessment Pain Assessment: No/denies pain    Home Living                          Prior Function            PT  Goals (current goals can now be found in the care plan section) Progress towards PT goals: Not progressing toward goals - comment (Pt reintubated and extubated since last tx)    Frequency    Min 3X/week      PT Plan Discharge plan needs to be updated    Co-evaluation              AM-PAC PT "6 Clicks" Mobility   Outcome Measure  Help needed turning from your back to your side while in a flat bed without using bedrails?: A Lot Help needed moving from lying on your back to sitting on the side of a flat bed without using bedrails?: A Lot Help needed moving to and from a bed to a chair (including a wheelchair)?: Total Help needed standing up from a chair using your arms (e.g., wheelchair or bedside chair)?: Total Help needed to walk in hospital room?: Total Help needed climbing 3-5 steps with a railing? : Total 6 Click Score: 8    End of Session Equipment Utilized During Treatment: Oxygen;Gait belt Activity Tolerance: Patient tolerated treatment well;Patient limited by fatigue Patient left: with call bell/phone within reach;with chair alarm set;in chair;with family/visitor present Nurse Communication: Mobility status (RN present) PT Visit Diagnosis: Muscle weakness (generalized) (M62.81);Other abnormalities of gait and mobility (R26.89)     Time: 6967-8938 PT Time Calculation (min) (ACUTE ONLY): 26 min  Charges:  $Therapeutic Activity: 23-37 mins                     Anise Salvo, PT Acute Rehab [facility] Rehab [phone]    Rayetta Humphrey 11/22/2021, 1:53 PM

## 2021-11-22 NOTE — Progress Notes (Addendum)
Nutrition Follow-up  DOCUMENTATION CODES:   Not applicable  INTERVENTION:   Continue tube feeding via Cortrak tube: Vital AF 1.2 at 70 ml/h (1680 ml per day).  Provides 2016 kcal, 126 gm protein, 1362 ml free water daily.  Banatrol TF BID - increase to TID  150 ml free water every 4 hours  Total free water: 2262 ml    NUTRITION DIAGNOSIS:   Inadequate oral intake related to inability to eat as evidenced by NPO status.  Ongoing   GOAL:   Patient will meet greater than or equal to 90% of their needs  Met with TF  MONITOR:   TF tolerance, I & O's  REASON FOR ASSESSMENT:   Ventilator, Consult Enteral/tube feeding initiation and management  ASSESSMENT:   31 yo male admitted with sepsis d/t aspiration PNA, AKI. 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  Discussed patient in ICU rounds and with RN today. One-way extubation 8/28 with plans to move to comfort if decompensates, pt in soft mit restraints with cortrak in placed for nutrition support. Dad does not want a PEG at this time.  SLP eval pending. Spoke with dad at bedside. Plan to transfer out of the ICU today. Pt sounds better than last visit, no gurgling noted.   8/13 - 8/23 intubated 8/16 - cortrak placed; tip  8/25 - 8/28 intubated  Labs reviewed.  CBG: 94-105  Medications reviewed and include protonix, Banatrol. Imodium ordered PRN starting 8/29   Admission weight 71.6 kg, current weight 71.2 kg.  Intake/Output Summary (Last 24 hours) at 11/22/2021 1102 Last data filed at 11/22/2021 1000 Gross per 24 hour  Intake 2460 ml  Output 1880 ml  Net 580 ml   Net IO Since Admission: 10,944.47 mL [11/22/21 1102]   Diet Order:   Diet Order             Diet NPO time specified  Diet effective now                   EDUCATION NEEDS:   No education needs have been identified at this time  Skin:  Skin Assessment: Reviewed RN Assessment (MASD to L thigh)  Last BM:  975 ml via rectal  tube  Height:   Ht Readings from Last 1 Encounters:  11/10/21 5' 6"  (1.676 m)    Weight:   Wt Readings from Last 1 Encounters:  11/21/21 70.6 kg    Ideal Body Weight:  64.5 kg  BMI:  Body mass index is 25.12 kg/m.  Estimated Nutritional Needs:   Kcal:  2000-2300  Protein:  100-120 gm  Fluid:  2-2.3 L  Janett Kamath P., RD, LDN, CNSC See AMiON for contact information

## 2021-11-22 NOTE — Progress Notes (Addendum)
Patient's restraint orders expiring.  Per notes 8/28, patient's father requested RN maintain restraints due to concern of patient pulling out Cortrak.   Spoke with MD Merrily Pew regarding expiring restraint order and patient's father's concerns.  MD Chand requested restraints be removed and soft mitts placed, as needed, at this time.  Patient's father not currently at bedside.  Requested MD update patient's father as appropriate.    18 - patient's father at bedside and updated on status of restraint removal and soft mitt application.  Patient's father agrees with plan of care.

## 2021-11-23 ENCOUNTER — Inpatient Hospital Stay (HOSPITAL_COMMUNITY): Payer: Medicare Other

## 2021-11-23 DIAGNOSIS — J9601 Acute respiratory failure with hypoxia: Secondary | ICD-10-CM

## 2021-11-23 DIAGNOSIS — A4151 Sepsis due to Escherichia coli [E. coli]: Principal | ICD-10-CM

## 2021-11-23 DIAGNOSIS — I509 Heart failure, unspecified: Secondary | ICD-10-CM

## 2021-11-23 DIAGNOSIS — R6520 Severe sepsis without septic shock: Secondary | ICD-10-CM | POA: Diagnosis not present

## 2021-11-23 LAB — BASIC METABOLIC PANEL
Anion gap: 12 (ref 5–15)
BUN: 16 mg/dL (ref 6–20)
CO2: 28 mmol/L (ref 22–32)
Calcium: 8.8 mg/dL — ABNORMAL LOW (ref 8.9–10.3)
Chloride: 107 mmol/L (ref 98–111)
Creatinine, Ser: 0.5 mg/dL — ABNORMAL LOW (ref 0.61–1.24)
GFR, Estimated: >60 mL/min (ref >60)
Glucose, Bld: 122 mg/dL — ABNORMAL HIGH (ref 70–99)
Potassium: 3.8 mmol/L (ref 3.5–5.1)
Sodium: 147 mmol/L — ABNORMAL HIGH (ref 135–145)

## 2021-11-23 LAB — CBC
HCT: 32.5 % — ABNORMAL LOW (ref 39.0–52.0)
Hemoglobin: 10.3 g/dL — ABNORMAL LOW (ref 13.0–17.0)
MCH: 30.6 pg (ref 26.0–34.0)
MCHC: 31.7 g/dL (ref 30.0–36.0)
MCV: 96.4 fL (ref 80.0–100.0)
Platelets: 323 10*3/uL (ref 150–400)
RBC: 3.37 MIL/uL — ABNORMAL LOW (ref 4.22–5.81)
RDW: 13.3 % (ref 11.5–15.5)
WBC: 5.6 10*3/uL (ref 4.0–10.5)
nRBC: 0 % (ref 0.0–0.2)

## 2021-11-23 LAB — PROCALCITONIN: Procalcitonin: 0.14 ng/mL

## 2021-11-23 LAB — MAGNESIUM: Magnesium: 1.9 mg/dL (ref 1.7–2.4)

## 2021-11-23 LAB — ECHOCARDIOGRAM COMPLETE
AR max vel: 2.75 cm2
AV Area VTI: 2.87 cm2
AV Area mean vel: 2.71 cm2
AV Mean grad: 3 mmHg
AV Peak grad: 4.8 mmHg
Ao pk vel: 1.1 m/s
Area-P 1/2: 5.97 cm2
Height: 66 in
S' Lateral: 2.5 cm
Weight: 2490.32 oz

## 2021-11-23 LAB — GLUCOSE, CAPILLARY
Glucose-Capillary: 101 mg/dL — ABNORMAL HIGH (ref 70–99)
Glucose-Capillary: 101 mg/dL — ABNORMAL HIGH (ref 70–99)
Glucose-Capillary: 108 mg/dL — ABNORMAL HIGH (ref 70–99)
Glucose-Capillary: 115 mg/dL — ABNORMAL HIGH (ref 70–99)
Glucose-Capillary: 111 mg/dL — ABNORMAL HIGH (ref 70–99)
Glucose-Capillary: 100 mg/dL — ABNORMAL HIGH (ref 70–99)
Glucose-Capillary: 115 mg/dL — ABNORMAL HIGH (ref 70–99)

## 2021-11-23 LAB — C-REACTIVE PROTEIN: CRP: 3.9 mg/dL — ABNORMAL HIGH (ref <1.0)

## 2021-11-23 LAB — PHOSPHORUS: Phosphorus: 3.6 mg/dL (ref 2.5–4.6)

## 2021-11-23 LAB — TSH: TSH: 4.356 u[IU]/mL (ref 0.350–4.500)

## 2021-11-23 LAB — MRSA NEXT GEN BY PCR, NASAL: MRSA by PCR Next Gen: NOT DETECTED

## 2021-11-23 MED ORDER — PANTOPRAZOLE 2 MG/ML SUSPENSION
40.0000 mg | Freq: Every day | ORAL | Status: DC
Start: 2021-11-23 — End: 2021-11-29
  Administered 2021-11-23 – 2021-11-28 (×5): 40 mg
  Filled 2021-11-23 (×7): qty 20

## 2021-11-23 MED ORDER — DEXTROSE 5 % IV SOLN: INTRAVENOUS | Status: AC

## 2021-11-23 MED ORDER — FREE WATER
250.0000 mL | Freq: Four times a day (QID) | Status: DC
  Administered 2021-11-23 – 2021-11-27 (×14): 250 mL

## 2021-11-23 NOTE — Progress Notes (Signed)
PROGRESS NOTE                                                                                                                                                                                                             Patient Demographics:    Todd Snyder, is a 31 y.o. male, DOB - Aug 07, 1990, [id number]  Outpatient Primary MD for the patient is Patient, No Pcp Per    LOS - 61  Admit date - 11/12/2021    Chief Complaint  Patient presents with   Respiratory Distress       Brief Narrative (HPI from H&P)   30 year old gentleman with history of autism who was admitted on 11/17/2021 with aspiration pneumonia after ileus induced nausea vomiting, he developed sepsis, toxic encephalopathy with acute hypoxic respiratory failure, he was admitted by pulmonary critical care intubated and extubated twice.  Placed on empiric antibiotics.  Finally stabilized, currently minimally responsive, on NG tube feeds, finishing his IV antibiotic treatment.  Transferred to hospitalist service under my care on 11/23/2021.  He is currently DNR plan is to continue gentle medical treatment if there is any further significant decline in the future father who is the primary decision maker wants to focus on comfort measures.  Significant events and procedures.  8/13 admitted, intubated, bronched.  8/14 clinically no ileus or SBO, no NGT output, so TF started 8/19 CXR not better despite CPT & nebs. CT shows posterior predominant loculated effusion. Still not amenable to bedside placement due to placement of effusion. IR consulted for CT-guided chest tube. 8/23 extubated, continues to have secretions 8/24 chest tube removed 8/25 copious secretions overnight, hypoxia, intubated. Vomited post intubation  8/28 extubated, continues to have secretions 11/23/2021.  Transferred to hospitalist service under my care on day 17 of his hospital stay   Subjective:     [name] today in bed at baseline does not talk, currently minimally responsive.  Unable to respond reliably to questions or commands.   Assessment  & Plan :   Septic shock due to E. coli aspiration pneumonia caused by nausea vomiting due to ileus.  Complicated by acute hypoxic respiratory failure, difficulty to maintain airway, intubation extubation twice.  Loculated left-sided pleural effusion and possible pneumothorax requiring CT-guided chest tube placement.  Transferred to my service on 11/23/2021 on day 17 of hospital stay.  He has been admitted by PCCM, has been stabilized, currently has NG tube feeds, finishing 7-day course of IV antibiotics for E. coli pneumonia, chest tube for pleural effusion and possible pneumothorax was placed and finally removed on 11/17/2021.  Sepsis pathophysiology seems to have resolved, chest x-ray shows gradual improvement, no signs of a large pneumothorax.  Does have some pleural effusion at the bases.  At this time plan is to continue gentle medical treatment which includes if needed IV antibiotics, IV fluids, PT OT and speech treatment.  If there is any further significant decline family/father who is the primary decision maker does not want reintubation on any further aggressive measures.  Wants to maintain DNR and transition to comfort measures if there is a significant decline in the future.  We will also involve palliative care for goals of care.   Toxic encephalopathy on top of underlying cerebral palsy.  Nonverbal at baseline.  Encephalopathy due to underlying infection and acute illness, supportive care.  No focal deficits.  Paroxysmal A-fib with RVR.  Caused by respiratory distress, initially required amiodarone drip, currently on beta-blocker.  [place] vasc 2 score of 1 or less.  No anticoagulation.  Check TSH and baseline echocardiogram.  Dehydration with hypernatremia.  Continue free water flushes via NG tube, dose adjusted on 11/23/2021, D5W IV for 1 day  as well.  Left eye conjunctivitis.  Improving on eyedrops.  Generalized weakness, deconditioning due to acute illness and underlying cerebral palsy.  PT OT and speech eval, supportive care.  He does walk with assistance at home at times uses walker.      Condition - Extremely Guarded  Family Communication  : Father bedside in detail on 11/23/2021, confirms DNR.  Confirms continue present line of care.  If any significant decline then comfort measures.     Code Status :  DNR  Consults  : PCCM, palliative care  PUD Prophylaxis : PPI   Procedures  :            Disposition Plan  :    Status is: Inpatient  DVT Prophylaxis  :    heparin injection 5,000 Units Start: 11/11/21 1400 Place and maintain sequential compression device Start: 11/09/21 2201    Lab Results  Component Value Date   PLT 323 11/23/2021    Diet :  Diet Order             Diet NPO time specified  Diet effective now                    Inpatient Medications  Scheduled Meds:  Chlorhexidine Gluconate Cloth  6 each Topical Daily   fiber supplement (BANATROL TF)  60 mL Per Tube TID   free water  250 mL Per Tube Q6H   Gerhardt's butt cream  1 Application Topical BID   heparin injection (subcutaneous)  5,000 Units Subcutaneous Q8H   ipratropium-albuterol  3 mL Nebulization Q6H   metoprolol tartrate  25 mg Per Tube BID   mouth rinse  15 mL Mouth Rinse 4 times per day   Continuous Infusions:  sodium chloride 250 mL (11/21/21 0030)   cefTRIAXone (ROCEPHIN)  IV Stopped (11/22/21 1514)   dextrose     feeding supplement (VITAL AF 1.2 CAL) 70 mL/hr at 11/23/21 1000   PRN Meds:.acetaminophen, loperamide, metoprolol tartrate, mouth rinse, white petrolatum  Time Spent in minutes  30   Susa Raring M.D on 11/23/2021 at 10:53 AM  To page go to [email or web address]  [facility] -  Office  [phone]  See all Orders from today for further details    Objective:   Vitals:   11/23/21 0752  11/23/21 0800 11/23/21 0900 11/23/21 1000  BP: 122/64 117/66 126/70 114/69  Pulse: (!) 108 (!) 104 97 93  Resp: (!) 25 (!) 23 19 (!) 25  Temp: 99.5 F (37.5 C)     TempSrc: Oral     SpO2: 95% 95% 97% 97%  Weight:      Height:        Wt Readings from Last 3 Encounters:  11/21/21 70.6 kg     Intake/Output Summary (Last 24 hours) at 11/23/2021 1053 Last data filed at 11/23/2021 0946 Gross per 24 hour  Intake 2349.86 ml  Output 675 ml  Net 1674.86 ml     Physical Exam  Awake but not alert, does not talk at baseline, currently following few intermittent basic commands, NG tube in place, moves all 4 extremities to painful stimuli, No apparent focal neurological deficits [place].AT,PERRAL Supple Neck, No JVD,   Symmetrical Chest wall movement, Good air movement bilaterally, CTAB RRR,No Gallops,Rubs or new Murmurs,  +ve B.Sounds, Abd Soft, No tenderness,   No Cyanosis, Clubbing or edema        Data Review:    CBC Recent Labs  Lab 11/19/21 0108 11/20/21 0622 11/21/21 0446 11/22/21 0027 11/23/21 0837  WBC 8.9 6.7 5.8 7.6 5.6  HGB 9.6* 9.3* 9.5* 10.0* 10.3*  HCT 29.7* 29.0* 29.5* 31.4* 32.5*  PLT 385 365 290 346 323  MCV 96.4 97.0 96.4 96.6 96.4  MCH 31.2 31.1 31.0 30.8 30.6  MCHC 32.3 32.1 32.2 31.8 31.7  RDW 13.8 13.6 13.4 13.3 13.3    Electrolytes Recent Labs  Lab 11/19/21 0108 11/20/21 0622 11/21/21 0446 11/22/21 0027 11/23/21 0110 11/23/21 0837  NA 142 142 143 144  --  147*  K 3.6 3.8 3.8 3.8  --  3.8  CL 110 113* 114* 113*  --  107  CO2 26 23 23 26   --  28  GLUCOSE 104* 119* 119* 101*  --  122*  BUN 17 18 16 15   --  16  CREATININE 0.61 0.48* 0.44* 0.47*  --  0.50*  CALCIUM 8.1* 7.8* 7.7* 8.2*  --  8.8*  MG 1.9 2.0 1.8 2.0 1.9  --   CRP  --   --   --   --   --  3.9*  PROCALCITON  --   --   --   --   --  0.14    Radiology Reports DG Chest Port 1 View  Result Date: 11/23/2021 CLINICAL DATA:  Respiratory distress. EXAM: PORTABLE CHEST 1 VIEW  COMPARISON:  Chest x-ray dated November 20, 2021. FINDINGS: Interval removal of the endotracheal tube and large bore enteric tube. Feeding tube remains in place with the tip below the field of view. Patchy left basilar opacity persists, and appears mildly worsened medially. Unchanged small left pleural effusion. No pneumothorax. No acute osseous abnormality. IMPRESSION: 1. Mildly worsened left basilar atelectasis and/or infiltrate. 2. Unchanged small left pleural effusion. Electronically Signed   By: 11/25/2021 M.D.   On: 11/23/2021 08:26   DG Chest Port 1 View  Result Date: 11/20/2021 CLINICAL DATA:  11/25/2021. Ventilator dependent respiratory failure were necrotizing lingular pneumonia. EXAM: PORTABLE CHEST 1 VIEW COMPARISON:  Portable chest August 25, chest CT November 12, 2021. FINDINGS: 4:16 a.m. There is a partial clearing of infiltrate from the left lower  lung field. Lingular air-fluid level in the lateral left lower lung field is less conspicuous as well. Small layering left pleural effusion which may have improved or redistributed. There are interspersed atelectatic bands in the left base, with the remaining lungs radiographically clear. The cardiomediastinal silhouette is normal. ETT tip is 2.3 cm from the carina, feeding tube traverses the stomach and courses off of the film in the gastric antrum, NGT angles to the left with tip in proximal fundus. Multiple overlying monitor wires.  Mild thoracic dextroscoliosis. IMPRESSION: Improved aeration of the left lower lung field and possible improvement in small left pleural effusion. In all other respects no further changes. Electronically Signed   By: Telford Nab M.D.   On: 11/20/2021 07:11

## 2021-11-23 NOTE — TOC Progression Note (Signed)
Transition of Care [facility]) - Progression Note    Patient Details  Name: Todd Snyder MRN: [id number] Date of Birth: May 14, 1990  [facility]) CM/SW Contact  Beckie Busing, RN Phone Number:[phone]  11/23/2021, 1:21 PM  Clinical Narrative:    TOC continues to follow for disposition needs.        Expected Discharge Plan and Services                                                 Social Determinants of Health (SDOH) Interventions    Readmission Risk Interventions     No data to display

## 2021-11-23 NOTE — Progress Notes (Signed)
Patient received from ICU. CHG bath given. Skin assessment completed with 2 nurses.Mouth care done. Vitals obtained,CCMD notified.

## 2021-11-23 NOTE — Progress Notes (Signed)
SLP Cancellation Note  Patient Details Name: Todd Snyder MRN: [id number] DOB: 03/04/1991   Cancelled treatment:       Reason Eval/Treat Not Completed: Other (comment) (patient getting CPT with RT. SLP will f/u next date for PO trials)   Angela Nevin, MA, CCC-SLP Speech Therapy

## 2021-11-24 DIAGNOSIS — Z7189 Other specified counseling: Secondary | ICD-10-CM

## 2021-11-24 DIAGNOSIS — J9601 Acute respiratory failure with hypoxia: Secondary | ICD-10-CM | POA: Diagnosis not present

## 2021-11-24 DIAGNOSIS — A4151 Sepsis due to Escherichia coli [E. coli]: Secondary | ICD-10-CM | POA: Diagnosis not present

## 2021-11-24 DIAGNOSIS — Z515 Encounter for palliative care: Secondary | ICD-10-CM

## 2021-11-24 DIAGNOSIS — R6520 Severe sepsis without septic shock: Secondary | ICD-10-CM | POA: Diagnosis not present

## 2021-11-24 LAB — CBC WITH DIFFERENTIAL/PLATELET
Abs Immature Granulocytes: 0.03 10*3/uL (ref 0.00–0.07)
Basophils Absolute: 0 10*3/uL (ref 0.0–0.1)
Basophils Relative: 1 %
Eosinophils Absolute: 0.2 10*3/uL (ref 0.0–0.5)
Eosinophils Relative: 3 %
HCT: 33.1 % — ABNORMAL LOW (ref 39.0–52.0)
Hemoglobin: 10.5 g/dL — ABNORMAL LOW (ref 13.0–17.0)
Immature Granulocytes: 1 %
Lymphocytes Relative: 13 %
Lymphs Abs: 0.8 10*3/uL (ref 0.7–4.0)
MCH: 30.4 pg (ref 26.0–34.0)
MCHC: 31.7 g/dL (ref 30.0–36.0)
MCV: 95.9 fL (ref 80.0–100.0)
Monocytes Absolute: 0.4 10*3/uL (ref 0.1–1.0)
Monocytes Relative: 6 %
Neutro Abs: 4.4 10*3/uL (ref 1.7–7.7)
Neutrophils Relative %: 76 %
Platelets: 306 10*3/uL (ref 150–400)
RBC: 3.45 MIL/uL — ABNORMAL LOW (ref 4.22–5.81)
RDW: 13.2 % (ref 11.5–15.5)
WBC: 5.7 10*3/uL (ref 4.0–10.5)
nRBC: 0 % (ref 0.0–0.2)

## 2021-11-24 LAB — GLUCOSE, CAPILLARY
Glucose-Capillary: 111 mg/dL — ABNORMAL HIGH (ref 70–99)
Glucose-Capillary: 136 mg/dL — ABNORMAL HIGH (ref 70–99)
Glucose-Capillary: 122 mg/dL — ABNORMAL HIGH (ref 70–99)
Glucose-Capillary: 104 mg/dL — ABNORMAL HIGH (ref 70–99)
Glucose-Capillary: 104 mg/dL — ABNORMAL HIGH (ref 70–99)

## 2021-11-24 LAB — BRAIN NATRIURETIC PEPTIDE: B Natriuretic Peptide: 8.7 pg/mL (ref 0.0–100.0)

## 2021-11-24 LAB — COMPREHENSIVE METABOLIC PANEL
ALT: 78 U/L — ABNORMAL HIGH (ref 0–44)
AST: 47 U/L — ABNORMAL HIGH (ref 15–41)
Albumin: 2.2 g/dL — ABNORMAL LOW (ref 3.5–5.0)
Alkaline Phosphatase: 308 U/L — ABNORMAL HIGH (ref 38–126)
Anion gap: 7 (ref 5–15)
BUN: 13 mg/dL (ref 6–20)
CO2: 26 mmol/L (ref 22–32)
Calcium: 8.4 mg/dL — ABNORMAL LOW (ref 8.9–10.3)
Chloride: 109 mmol/L (ref 98–111)
Creatinine, Ser: 0.46 mg/dL — ABNORMAL LOW (ref 0.61–1.24)
GFR, Estimated: >60 mL/min (ref >60)
Glucose, Bld: 120 mg/dL — ABNORMAL HIGH (ref 70–99)
Potassium: 3.8 mmol/L (ref 3.5–5.1)
Sodium: 142 mmol/L (ref 135–145)
Total Bilirubin: 0.5 mg/dL (ref 0.3–1.2)
Total Protein: 5.7 g/dL — ABNORMAL LOW (ref 6.5–8.1)

## 2021-11-24 LAB — PROCALCITONIN: Procalcitonin: 0.17 ng/mL

## 2021-11-24 LAB — PHOSPHORUS: Phosphorus: 3.2 mg/dL (ref 2.5–4.6)

## 2021-11-24 LAB — C-REACTIVE PROTEIN: CRP: 2.9 mg/dL — ABNORMAL HIGH (ref <1.0)

## 2021-11-24 LAB — MAGNESIUM: Magnesium: 1.9 mg/dL (ref 1.7–2.4)

## 2021-11-24 MED ORDER — IPRATROPIUM-ALBUTEROL 0.5-2.5 (3) MG/3ML IN SOLN
3.0000 mL | Freq: Three times a day (TID) | RESPIRATORY_TRACT | Status: DC
  Administered 2021-11-25 – 2021-11-29 (×13): 3 mL via RESPIRATORY_TRACT
  Filled 2021-11-24 (×14): qty 3

## 2021-11-24 NOTE — Consult Note (Signed)
Consultation Note Date: 11/24/2021   Patient Name: Todd Snyder  DOB: 1990/10/19  MRN: [id number]  Age / Sex: 31 y.o., male  PCP: Patient, No Pcp Per Referring Physician: Thurnell Lose, MD  Reason for Consultation: Establishing goals of care  HPI/Patient Profile: 31 y.o. male  with past medical history of autism admitted on 11/07/2021 with ileus, E. coli aspiration pneumonia, sepsis requiring intubation x2. Prolonged hospitalization complicated by ongoing respiratory failure, afib, chest tube (since removed). Ongoing support with Cortrak temporary tube feeds to support while he works with therapy.   Clinical Assessment and Goals of Care: I met today with Todd Snyder's father, Todd Snyder, after receiving report from Dr. Candiss Norse. Todd Snyder shares about Todd Snyder living in group home environment in [place] for about a year now. He explains how Todd Snyder was physically functional and enjoyed walking, the pool and beach, and able to toilet himself. He enjoys game shows, cartoons, [employer], and music (especially Disney musical movies). Todd Snyder's mother died from cancer ~18 years ago and his father with help from older sister and CAPS have been caring for Todd Snyder until recently.   I discussed goals of care with Todd Snyder. Todd Snyder shares that he has expressed his wishes multiple times to multiple providers. He would never desire trach or PEG for Todd Snyder. He confirms DNR status. He is hopeful that Corday can have some recovery of functional status and desires time for outcomes. He would not desire life prolonging measures if Todd Snyder is not improving to an acceptable quality of life but he wishes to give him time and a chance. Todd Snyder is very reasonable and knows what he wishes for his son. I provided him with palliative contact information and shared that we will be available to assist when needed and feel free to call us with any questions or concerns.   All  questions/concerns addressed. Emotional support provided.   Primary Decision Maker NEXT OF KIN father Todd Snyder    SUMMARY OF RECOMMENDATIONS   - DNR - Watchful waiting - Low threshold for comfort with further decline - NO PEG  Code Status/Advance Care Planning: DNR   Symptom Management:  Per attending.   Prognosis:  To be determined.   Discharge Planning: To Be Determined      Primary Diagnoses: Present on Admission:  Sepsis ([place])  Acute respiratory failure with hypoxia ([place])   I have reviewed the medical record, interviewed the patient and family, and examined the patient. The following aspects are pertinent.  Past Medical History:  Diagnosis Date   Autism    Bilateral club feet    Social History   Socioeconomic History   Marital status: Single    Spouse name: Not on file   Number of children: Not on file   Years of education: Not on file   Highest education level: Not on file  Occupational History   Not on file  Tobacco Use   Smoking status: Not on file   Smokeless tobacco: Not on file  Substance and Sexual Activity   Alcohol use:  Not on file   Drug use: Not on file   Sexual activity: Not on file  Other Topics Concern   Not on file  Social History Narrative   Not on file   Social Determinants of Health   Financial Resource Strain: Not on file  Food Insecurity: Not on file  Transportation Needs: Not on file  Physical Activity: Not on file  Stress: Not on file  Social Connections: Not on file   No family history on file. Scheduled Meds:  Chlorhexidine Gluconate Cloth  6 each Topical Daily   fiber supplement (BANATROL TF)  60 mL Per Tube TID   free water  250 mL Per Tube Q6H   Gerhardt's butt cream  1 Application Topical BID   heparin injection (subcutaneous)  5,000 Units Subcutaneous Q8H   ipratropium-albuterol  3 mL Nebulization Q6H   metoprolol tartrate  25 mg Per Tube BID   mouth rinse  15 mL Mouth Rinse 4 times per day   pantoprazole  40  mg Per Tube Daily   Continuous Infusions:  sodium chloride 250 mL (11/21/21 0030)   dextrose 110 mL/hr at 11/24/21 0128   feeding supplement (VITAL AF 1.2 CAL) 70 mL/hr at 11/23/21 1400   PRN Meds:.acetaminophen, loperamide, metoprolol tartrate, mouth rinse, white petrolatum Allergies  Allergen Reactions   Latex    Review of Systems  Unable to perform ROS: Acuity of condition    Physical Exam Vitals and nursing note reviewed.  Constitutional:      General: He is not in acute distress.    Appearance: He is ill-appearing.  Cardiovascular:     Rate and Rhythm: Tachycardia present.  Pulmonary:     Effort: No tachypnea, accessory muscle usage or respiratory distress.  Abdominal:     General: Abdomen is flat.  Neurological:     Mental Status: He is alert.     Comments: Non-verbal at baseline Does not follow commands for me at this time (although reportedly following commands at times)     Vital Signs: BP 129/72 (BP Location: Left Arm)   Pulse (!) 109   Temp 98.6 F (37 C) (Oral)   Resp 20   Ht _0  (1.676 m)   Wt 70.8 kg   SpO2 100%   BMI 25.19 kg/m  Pain Scale: Faces   Pain Score: Asleep   SpO2: SpO2: 100 % O2 Device:SpO2: 100 % O2 Flow Rate: .O2 Flow Rate (L/min): 2 L/min  IO: Intake/output summary:  Intake/Output Summary (Last 24 hours) at 11/24/2021 1025 Last data filed at 11/24/2021 7871 Gross per 24 hour  Intake 4523.88 ml  Output 2195 ml  Net 2328.88 ml    LBM: Last BM Date : 11/23/21 Baseline Weight: Weight: 74.8 kg Most recent weight: Weight: 70.8 kg     Palliative Assessment/Data:     Time In: 1130  Time Total: 55 min  Greater than 50%  of this time was spent counseling and coordinating care related to the above assessment and plan.  Signed by: Vinie Sill, NP Palliative Medicine Team Pager # [phone] (M-F 8a-5p) Team Phone # [phone] (Nights/Weekends)

## 2021-11-24 NOTE — Progress Notes (Signed)
SLP Cancellation Note  Patient Details Name: Todd Snyder MRN: [id number] DOB: 1990/08/25   Cancelled treatment:       Reason Eval/Treat Not Completed: Other (comment) (patient getting CPT. SLP will plan to f/u next date schedule permitting)   Angela Nevin, MA, CCC-SLP Speech Therapy

## 2021-11-24 NOTE — Evaluation (Addendum)
Occupational Therapy Evaluation Patient Details Name: Todd Snyder MRN: [id number] DOB: [birth year] Today's Date: 11/24/2021   History of Present Illness Pt is a 31 y/o male presenting as a transfer from [place] with septic shock due to LLL PNA, toxic encephalopatly. Pt intubated 8/13-8/23, Chest tube 8/19-8/24, reintubated 8/25-8/28.  PMHx: Autism and Bilateral club feet   Clinical Impression   Pt currently total assist for all ADLs at this time as well as total +2 for standing and step pivot transfers to the bedside recliner.  Recommend acute care OT to help progress pt back to baseline function so he can return back to [facility] with family.  Unsure how much physical assist they can provide, but feel pt will need long term rehab post acute at SNF if family cannot provide enough physical assist to take him home from acute care once medically stable.         Recommendations for follow up therapy are one component of a multi-disciplinary discharge planning process, led by the attending physician.  Recommendations may be updated based on patient status, additional functional criteria and insurance authorization.   Follow Up Recommendations  Skilled nursing-short term rehab (<3 hours/day)    Assistance Recommended at Discharge Frequent or constant Supervision/Assistance  Patient can return home with the following Two people to help with walking and/or transfers;Two people to help with bathing/dressing/bathroom;Direct supervision/assist for medications management;Help with stairs or ramp for entrance;Assist for transportation;Assistance with feeding;Assistance with cooking/housework;Direct supervision/assist for financial management    Functional Status Assessment  Patient has had a recent decline in their functional status and demonstrates the ability to make significant improvements in function in a reasonable and predictable amount of time.  Equipment Recommendations  None recommended by OT        Precautions / Restrictions Precautions Precautions: Fall Precaution Comments: non-verbal Restrictions Weight Bearing Restrictions: No      Mobility Bed Mobility Overal bed mobility: Needs Assistance Bed Mobility: Supine to Sit     Supine to sit: Total assist, +2 for safety/equipment          Transfers Overall transfer level: Needs assistance Equipment used: 2 person hand held assist Transfers: Bed to chair/wheelchair/BSC, Sit to/from Stand Sit to Stand: Total assist, +2 physical assistance (decreased hip, trunk, and knee extension in standing.)          Lateral/Scoot Transfers: Total assist, +2 physical assistance General transfer comment: Pt needed total +2 for step pivot transfer to the recliner to maintain hip, trunk, and knee extension.  He would try to sit down frequently with standing requiring facilitation from therapists to maintain standing.  Therapist also had to assist with advancing his LLE when transferring to the left.      Balance Overall balance assessment: Needs assistance Sitting-balance support: No upper extremity supported, Feet supported, Bilateral upper extremity supported Sitting balance-Leahy Scale: Zero Sitting balance - Comments: overall total assist for static sitting balance secondary to extensor posturing Postural control: Posterior lean Standing balance support: During functional activity Standing balance-Leahy Scale: Zero Standing balance comment: Pt needing two therapist assist for standing during this session.                           ADL either performed or assessed with clinical judgement   ADL Overall ADL's : Needs assistance/impaired Eating/Feeding: NPO (currently with cortrak)   Grooming: Total assistance   Upper Body Bathing: Total assistance;Sitting   Lower Body Bathing: +2 for physical assistance;Total  assistance           Toilet Transfer: +2 for physical assistance;Total assistance;Stand-pivot Toilet  Transfer Details (indicate cue type and reason): simulated to the recliner Toileting- Clothing Manipulation and Hygiene: Total assistance;+2 for physical assistance;Sit to/from stand       Functional mobility during ADLs: Total assistance;+2 for physical assistance General ADL Comments: During stand pivot transfer, therapist having to assist with weightshift and advancing his LLE with transfer to the left.  Pt needed total assist for static sitting balance with extensor patterns noted as well with attempts to lay back down.  Oxygen sats at 91% or greater on 1L nasal cannula.  Completed 2 sit to stand transitons as well with pt activating LEs to assist but then attempting to sit down frequently.  Total +2 for maintaining hip and trunk extension.     Vision Baseline Vision/History: 0 No visual deficits (Pt's father reports no visual deficits and that pt likes to watch TV and is very "visual".) Ability to See in Adequate Light: 0 Adequate Vision Assessment?: Vision impaired- to be further tested in functional context     Perception Perception Perception: Not tested   Praxis Praxis Praxis: Not tested    Pertinent Vitals/Pain Pain Assessment Pain Assessment: Faces Pain Score: 0-No pain Pain Intervention(s): Monitored during session     Hand Dominance Right   Extremity/Trunk Assessment Upper Extremity Assessment Upper Extremity Assessment: Difficult to assess due to impaired cognition;Generalized weakness;RUE deficits/detail;LUE deficits/detail (difficult to assess secondary to pt's limited ability to follow commands.  Per his father, he is usually able to feed himself and move his arms spontaneously for ADL tasks with limited FM coordination when it comes to tying shoes or fastening buttons.) RUE Deficits / Details: Pt able to raise arm on one occasion for placement of pillow, but unable to use UE to assist with any functional tasks currently.  Slight increased swelling noted in the right  hand as well from IV infiltration yesterday. RUE Coordination: decreased fine motor;decreased gross motor LUE Deficits / Details: Pt only moving the arm slightly to help position pillow under it with mod faciliation.  Will continue to assess in function. LUE Coordination: decreased gross motor;decreased fine motor           Communication Communication Communication: Other (comment) (non-verbal)   Cognition Arousal/Alertness: Awake/alert Behavior During Therapy: Flat affect (Pt with increased extensor posturing in sitting, trying to lay back down without warning.) Overall Cognitive Status: History of cognitive impairments - at baseline                                 General Comments: Pt non-verbal only able to follow 50% of one step commands related to helping with standing, lifting foot when therapist was donning shoes, and being able to raise his arm to command.     General Comments  VSS on 1L with SpO2 maintaining >92% throughout            Home Living Family/patient expects to be discharged to:: Private residence (Family home with 24/7 care.) Living Arrangements: Non-relatives/Friends (Lives in [place] with [facility] family most of the time.) Available Help at Discharge: Personal care attendant;Available 24 hours/day Type of Home: House       Home Layout: One level     Bathroom Shower/Tub: Walk-in shower         Home Equipment: None  Prior Functioning/Environment Prior Level of Function : Needs assist             Mobility Comments: ambulates with shoes, HHA, no AD, loves pushing cart ADLs Comments: Needs assist with bathing and dressing tasks but can usually complete most tasks with verbal/tactile commands per his father.        OT Problem List: Decreased strength;Decreased knowledge of use of DME or AE;Decreased range of motion;Decreased coordination;Decreased cognition;Decreased activity tolerance;Impaired UE functional use;Impaired  balance (sitting and/or standing);Decreased safety awareness      OT Treatment/Interventions: Self-care/ADL training;Therapeutic exercise;Patient/family education;Balance training;Therapeutic activities;DME and/or AE instruction;Neuromuscular education;Cognitive remediation/compensation    OT Goals(Current goals can be found in the care plan section) Acute Rehab OT Goals Patient Stated Goal: Pt unable to state, his father wants him to get back to baseline OT Goal Formulation: With patient Time For Goal Achievement: 12/08/21 Potential to Achieve Goals: Fair ADL Goals Pt Will Perform Grooming: with min assist;sitting (supportive sitting wash face and hands) Pt Will Perform Upper Body Bathing: with min assist (supportive sitting) Pt Will Transfer to Toilet: with max assist;stand pivot transfer;bedside commode Additional ADL Goal #1: Pt will maintain static sitting balance for greater than 3 mins with min guard assist in preparation for selfcare tasks.  OT Frequency: Min 2X/week    Co-evaluation PT/OT/SLP Co-Evaluation/Treatment: Yes Reason for Co-Treatment: For patient/therapist safety;To address functional/ADL transfers PT goals addressed during session: Mobility/safety with mobility;Balance OT goals addressed during session: ADL's and self-care;Strengthening/ROM      AM-PAC OT "6 Clicks" Daily Activity     Outcome Measure Help from another person eating meals?: Total Help from another person taking care of personal grooming?: Total Help from another person toileting, which includes using toliet, bedpan, or urinal?: Total Help from another person bathing (including washing, rinsing, drying)?: Total Help from another person to put on and taking off regular upper body clothing?: Total Help from another person to put on and taking off regular lower body clothing?: Total 6 Click Score: 6   End of Session Nurse Communication: Need for lift equipment  Activity Tolerance: Patient tolerated  treatment well Patient left: in chair;with call bell/phone within reach;with family/visitor present  OT Visit Diagnosis: Unsteadiness on feet (R26.81);Other abnormalities of gait and mobility (R26.89);Muscle weakness (generalized) (M62.81);Feeding difficulties (R63.3);Other symptoms and signs involving cognitive function                Time: 1021-1058 OT Time Calculation (min): 37 min Charges:  OT General Charges $OT Visit: 1 Visit OT Evaluation $OT Eval High Complexity: 1 High Akeiba Axelson OTR/L 11/24/2021, 2:16 PM

## 2021-11-24 NOTE — Progress Notes (Signed)
PROGRESS NOTE                                                                                                                                                                                                             Todd Snyder Demographics:    Todd Snyder, is a 31 y.o. male, DOB - [birth year], [id number]  Outpatient Primary MD for the Todd Snyder is Todd Snyder, No Pcp Per    LOS - 34  Admit date - 11/01/2021    Chief Complaint  Todd Snyder presents with   Respiratory Distress       Brief Narrative (HPI from H&P)   31 year old gentleman with history of autism who was admitted on 11/10/2021 with aspiration pneumonia after ileus induced nausea vomiting, he developed sepsis, toxic encephalopathy with acute hypoxic respiratory failure, he was admitted by pulmonary critical care intubated and extubated twice.  Placed on empiric antibiotics.  Finally stabilized, currently minimally responsive, on NG tube feeds, finishing his IV antibiotic treatment.  Transferred to hospitalist service under my care on 11/23/2021.  He is currently DNR plan is to continue gentle medical treatment if there is any further significant decline in the future father who is the primary decision maker wants to focus on comfort measures.  Significant events and procedures.  8/13 admitted, intubated, bronched.  8/14 clinically no ileus or SBO, no NGT output, so TF started 8/19 CXR not better despite CPT & nebs. CT shows posterior predominant loculated effusion. Still not amenable to bedside placement due to placement of effusion. IR consulted for CT-guided chest tube. 8/23 extubated, continues to have secretions 8/24 chest tube removed 8/25 copious secretions overnight, hypoxia, intubated. Vomited post intubation  8/28 extubated, continues to have secretions 11/23/2021.  Transferred to hospitalist service under my care on day 17 of his hospital stay 11/23/2021.   Echocardiogram. 1. Left ventricular ejection fraction, by estimation, is 60 to 65%. The left ventricle has normal function. The left ventricle has no regional wall motion abnormalities. Left ventricular diastolic parameters were normal.  2. Right ventricular systolic function is normal. The right ventricular size is normal. Tricuspid regurgitation signal is inadequate for assessing PA pressure.  3. The mitral valve is normal in structure. No evidence of mitral valve regurgitation. No evidence of mitral stenosis.  4. The aortic valve is normal in structure. Aortic valve regurgitation is not visualized.  No aortic stenosis is present.  5. The inferior vena cava is normal in size with greater than 50% respiratory variability, suggesting right atrial pressure of 3 mmHg.   Subjective:    Todd Snyder today in bed at baseline does not talk, currently minimally responsive.  Unable to respond reliably to questions or commands.   Assessment  & Plan :   Septic shock due to E. coli aspiration pneumonia caused by nausea vomiting due to ileus.  Complicated by acute hypoxic respiratory failure, difficulty to maintain airway, intubation extubation twice.  Loculated left-sided pleural effusion and possible pneumothorax requiring CT-guided chest tube placement.  Transferred to my service on 11/23/2021 on day 17 of hospital stay.  He has been admitted by PCCM, has been stabilized, currently has NG tube feeds, finishing 7-day course of IV antibiotics for E. coli pneumonia, chest tube for pleural effusion and possible pneumothorax was placed and finally removed on 11/17/2021.  Sepsis pathophysiology seems to have resolved, chest x-ray shows gradual improvement, no signs of a large pneumothorax.  Does have some pleural effusion at the bases.  At this time plan is to continue gentle medical treatment which includes if needed IV antibiotics, IV fluids, PT OT and speech treatment.  If there is any further significant decline  family/father who is the primary decision maker does not want reintubation on any further aggressive measures.  Wants to maintain DNR and transition to comfort measures if there is a significant decline in the future.  We will also involve palliative care for goals of care.   Toxic encephalopathy on top of underlying cerebral palsy.  Nonverbal at baseline.  Encephalopathy due to underlying infection and acute illness, supportive care.  No focal deficits.  Paroxysmal A-fib with RVR.  Caused by respiratory distress, initially required amiodarone drip, currently on beta-blocker.  [place] vasc 2 score of 1 or less.  No anticoagulation, stable TSH and baseline echocardiogram with preserved EF of 60%.  Dehydration with hypernatremia.  Continue free water flushes via NG tube, dose adjusted on 11/23/2021, gentle D5W IV given.  Resolved hypernatremia.  Left eye conjunctivitis.  Improving on eyedrops.  Generalized weakness, deconditioning due to acute illness and underlying cerebral palsy.  PT OT and speech eval, supportive care.  He does walk with assistance at home at times uses walker.      Condition - Extremely Guarded  Family Communication  : Father bedside in detail on 11/23/2021, confirms DNR.  Confirms continue present line of care.  If any significant decline then comfort measures.     Code Status :  DNR  Consults  : PCCM, palliative care  PUD Prophylaxis : PPI   Procedures  :            Disposition Plan  :    Status is: Inpatient  DVT Prophylaxis  :    heparin injection 5,000 Units Start: 11/11/21 1400 Place and maintain sequential compression device Start: 11/09/21 2201    Lab Results  Component Value Date   PLT 306 11/24/2021    Diet :  Diet Order             Diet NPO time specified  Diet effective now                    Inpatient Medications  Scheduled Meds:  Chlorhexidine Gluconate Cloth  6 each Topical Daily   fiber supplement (BANATROL TF)  60 mL Per  Tube TID   free water  250 mL  Per Tube Q6H   Gerhardt's butt cream  1 Application Topical BID   heparin injection (subcutaneous)  5,000 Units Subcutaneous Q8H   ipratropium-albuterol  3 mL Nebulization Q6H   metoprolol tartrate  25 mg Per Tube BID   mouth rinse  15 mL Mouth Rinse 4 times per day   pantoprazole  40 mg Per Tube Daily   Continuous Infusions:  sodium chloride 250 mL (11/21/21 0030)   dextrose 110 mL/hr at 11/24/21 0128   feeding supplement (VITAL AF 1.2 CAL) 70 mL/hr at 11/23/21 1400   PRN Meds:.acetaminophen, loperamide, metoprolol tartrate, mouth rinse, white petrolatum  Time Spent in minutes  30   Lala Lund M.D on 11/24/2021 at 10:05 AM  To page go to [email or web address]   [facility] -  Office  [phone]  See all Orders from today for further details    Objective:   Vitals:   11/24/21 0613 11/24/21 0622 11/24/21 0800 11/24/21 0900  BP:   129/72   Pulse:   (!) 109   Resp:   20   Temp:   98.6 F (37 C)   TempSrc:   Oral   SpO2:   98% 100%  Weight: 72.2 kg 70.8 kg    Height:        Wt Readings from Last 3 Encounters:  11/24/21 70.8 kg     Intake/Output Summary (Last 24 hours) at 11/24/2021 1005 Last data filed at 11/24/2021 [id number] Gross per 24 hour  Intake 4523.88 ml  Output 2195 ml  Net 2328.88 ml     Physical Exam  Awake but not alert, does not talk at baseline, currently following few intermittent basic commands, NG tube in place, moves all 4 extremities to painful stimuli, No apparent focal neurological deficits [place].AT,PERRAL Supple Neck, No JVD,   Symmetrical Chest wall movement, Good air movement bilaterally, CTAB RRR,No Gallops, Rubs or new Murmurs,  +ve B.Sounds, Abd Soft, No tenderness,   No Cyanosis, Clubbing or edema        Data Review:    CBC Recent Labs  Lab 11/20/21 0622 11/21/21 0446 11/22/21 0027 11/23/21 0837 11/24/21 0249  WBC 6.7 5.8 7.6 5.6 5.7  HGB 9.3* 9.5* 10.0* 10.3* 10.5*  HCT 29.0* 29.5* 31.4*  32.5* 33.1*  PLT 365 290 346 323 306  MCV 97.0 96.4 96.6 96.4 95.9  MCH 31.1 31.0 30.8 30.6 30.4  MCHC 32.1 32.2 31.8 31.7 31.7  RDW 13.6 13.4 13.3 13.3 13.2  LYMPHSABS  --   --   --   --  0.8  MONOABS  --   --   --   --  0.4  EOSABS  --   --   --   --  0.2  BASOSABS  --   --   --   --  0.0    Electrolytes Recent Labs  Lab 11/20/21 0622 11/21/21 0446 11/22/21 0027 11/23/21 0110 11/23/21 0837 11/24/21 0249  NA 142 143 144  --  147* 142  K 3.8 3.8 3.8  --  3.8 3.8  CL 113* 114* 113*  --  107 109  CO2 23 23 26   --  28 26  GLUCOSE 119* 119* 101*  --  122* 120*  BUN 18 16 15   --  16 13  CREATININE 0.48* 0.44* 0.47*  --  0.50* 0.46*  CALCIUM 7.8* 7.7* 8.2*  --  8.8* 8.4*  AST  --   --   --   --   --  47*  ALT  --   --   --   --   --  78*  ALKPHOS  --   --   --   --   --  308*  BILITOT  --   --   --   --   --  0.5  ALBUMIN  --   --   --   --   --  2.2*  MG 2.0 1.8 2.0 1.9  --  1.9  CRP  --   --   --   --  3.9* 2.9*  PROCALCITON  --   --   --   --  0.14 0.17  TSH  --   --   --   --  4.356  --   BNP  --   --   --   --   --  8.7    Radiology Reports ECHOCARDIOGRAM COMPLETE  Result Date: 11/23/2021    ECHOCARDIOGRAM REPORT   Todd Snyder Name:   PEREGRINE STUDZINSKI Date of Exam: 11/23/2021 Medical Rec #:  [id number]       Height:       66.0 in Accession #:    [id number]      Weight:       155.6 lb Date of Birth:  [birth year]       BSA:          1.798 m Todd Snyder Age:    31 years        BP:           104/63 mmHg Todd Snyder Gender: M               HR:           104 bpm. Exam Location:  Inpatient Procedure: 2D Echo, Cardiac Doppler and Color Doppler Indications:    CHF  History:        Todd Snyder has no prior history of Echocardiogram examinations.  Sonographer:    Memory Argue Referring Phys: Graylin Shiver [place]  1. Left ventricular ejection fraction, by estimation, is 60 to 65%. The left ventricle has normal function. The left ventricle has no regional wall motion abnormalities. Left  ventricular diastolic parameters were normal.  2. Right ventricular systolic function is normal. The right ventricular size is normal. Tricuspid regurgitation signal is inadequate for assessing PA pressure.  3. The mitral valve is normal in structure. No evidence of mitral valve regurgitation. No evidence of mitral stenosis.  4. The aortic valve is normal in structure. Aortic valve regurgitation is not visualized. No aortic stenosis is present.  5. The inferior vena cava is normal in size with greater than 50% respiratory variability, suggesting right atrial pressure of 3 mmHg. FINDINGS  Left Ventricle: Left ventricular ejection fraction, by estimation, is 60 to 65%. The left ventricle has normal function. The left ventricle has no regional wall motion abnormalities. The left ventricular internal cavity size was normal in size. There is  no left ventricular hypertrophy. Left ventricular diastolic parameters were normal. Normal left ventricular filling pressure. Right Ventricle: The right ventricular size is normal. No increase in right ventricular wall thickness. Right ventricular systolic function is normal. Tricuspid regurgitation signal is inadequate for assessing PA pressure. Left Atrium: Left atrial size was normal in size. Right Atrium: Right atrial size was normal in size. Pericardium: There is no evidence of pericardial effusion. Mitral Valve: The mitral valve is normal in structure. No evidence of mitral valve regurgitation. No evidence of mitral  valve stenosis. Tricuspid Valve: The tricuspid valve is normal in structure. Tricuspid valve regurgitation is not demonstrated. No evidence of tricuspid stenosis. Aortic Valve: The aortic valve is normal in structure. Aortic valve regurgitation is not visualized. No aortic stenosis is present. Aortic valve mean gradient measures 3.0 mmHg. Aortic valve peak gradient measures 4.8 mmHg. Aortic valve area, by VTI measures 2.87 cm. Pulmonic Valve: The pulmonic valve  was normal in structure. Pulmonic valve regurgitation is not visualized. No evidence of pulmonic stenosis. Aorta: The aortic root is normal in size and structure. Venous: The inferior vena cava is normal in size with greater than 50% respiratory variability, suggesting right atrial pressure of 3 mmHg. IAS/Shunts: No atrial level shunt detected by color flow Doppler.  LEFT VENTRICLE PLAX 2D LVIDd:         4.10 cm   Diastology LVIDs:         2.50 cm   LV e' medial:    11.90 cm/s LV PW:         0.90 cm   LV E/e' medial:  6.8 LV IVS:        1.10 cm   LV e' lateral:   8.38 cm/s LVOT diam:     2.10 cm   LV E/e' lateral: 9.6 LV SV:         45 LV SV Index:   25 LVOT Area:     3.46 cm  RIGHT VENTRICLE RV S prime:     15.90 cm/s TAPSE (M-mode): 1.9 cm LEFT ATRIUM             Index        RIGHT ATRIUM          Index LA diam:        2.90 cm 1.61 cm/m   RA Area:     8.68 cm LA Vol (A2C):   48.0 ml 26.70 ml/m  RA Volume:   16.20 ml 9.01 ml/m LA Vol (A4C):   45.3 ml 25.20 ml/m LA Biplane Vol: 47.5 ml 26.42 ml/m  AORTIC VALVE AV Area (Vmax):    2.75 cm AV Area (Vmean):   2.71 cm AV Area (VTI):     2.87 cm AV Vmax:           110.00 cm/s AV Vmean:          72.700 cm/s AV VTI:            0.158 m AV Peak Grad:      4.8 mmHg AV Mean Grad:      3.0 mmHg LVOT Vmax:         87.30 cm/s LVOT Vmean:        56.800 cm/s LVOT VTI:          0.131 m LVOT/AV VTI ratio: 0.83  AORTA Ao Root diam: 2.60 cm Ao Asc diam:  2.60 cm MITRAL VALVE MV Area (PHT): 5.97 cm    SHUNTS MV Decel Time: 127 msec    Systemic VTI:  0.13 m MV E velocity: 80.50 cm/s  Systemic Diam: 2.10 cm MV A velocity: 54.10 cm/s MV E/A ratio:  1.49 Mihai Croitoru MD Electronically signed by Sanda Klein MD Signature Date/Time: 11/23/2021/3:35:04 PM    Final    DG Chest Port 1 View  Result Date: 11/23/2021 CLINICAL DATA:  Respiratory distress. EXAM: PORTABLE CHEST 1 VIEW COMPARISON:  Chest x-ray dated November 20, 2021. FINDINGS: Interval removal of the endotracheal tube  and large bore enteric tube. Feeding tube  remains in place with the tip below the field of view. Patchy left basilar opacity persists, and appears mildly worsened medially. Unchanged small left pleural effusion. No pneumothorax. No acute osseous abnormality. IMPRESSION: 1. Mildly worsened left basilar atelectasis and/or infiltrate. 2. Unchanged small left pleural effusion. Electronically Signed   By: Obie Dredge M.D.   On: 11/23/2021 08:26

## 2021-11-24 NOTE — Progress Notes (Signed)
Speech Language Pathology Treatment: Dysphagia  Patient Details Name: Todd Snyder MRN: [id number] DOB: 09-May-1990 Today's Date: 11/24/2021 Time: 0945-1000 SLP Time Calculation (min) (ACUTE ONLY): 15 min  Assessment / Plan / Recommendation Clinical Impression  Patient seen by SLP for skilled treatment session focused on dysphagia goals. Patient's father in room. Patient was sitting up in bed being orally suctioned by his father. Patient then was receptive to SLP using suction toothbrush to clean teeth but when regular [place] suction introduced again, he refused this. SLP spoke with patient's father regarding overall SLP goals which are to continue assessing his potential to accept PO's and determine if an objective swallow study would be beneficial. No PO's attempted during this session as patient then had eyes closed and appeared to be sleeping. SLP plans to attempt return this PM schedule permitting.    HPI HPI: 31 year old man who remains critically ill due to acute hypoxic respiratory failure secondary to likely aspiration pneumonia after suspected aspiration of emesis. Prolonged intubation (8/08-28-2023). 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 PMH includes aspiration PNA.      SLP Plan  Continue with current plan of care      Recommendations for follow up therapy are one component of a multi-disciplinary discharge planning process, led by the attending physician.  Recommendations may be updated based on patient status, additional functional criteria and insurance authorization.    Recommendations  Diet recommendations: NPO Medication Administration: Via alternative means                Oral Care Recommendations: Oral care QID;Staff/trained caregiver to provide oral care Follow Up Recommendations: Other (comment) Assistance recommended at discharge: Frequent or constant Supervision/Assistance SLP Visit Diagnosis: Dysphagia, unspecified (R13.10) Plan:  Continue with current plan of care           Angela Nevin, MA, CCC-SLP Speech Therapy

## 2021-11-24 NOTE — Progress Notes (Signed)
Physical Therapy Treatment Patient Details Name: Todd Snyder MRN: [id number] DOB: 27-Dec-1990 Today's Date: 11/24/2021   History of Present Illness Pt is a 31 y/o male presenting as a transfer from [facility] with septic shock due to LLL PNA. Pt intubated 8/November 11, 2021, Chest tube 8/19-8/24, reintubated 8/25-8/28.  PMHx: Autism and Bilateral club feet    PT Comments    Pt seen for PT/OT session with focus on continued progression of OOB mobility for increased activity tolerance and progression of functional mobility and independence. Pt requiring up to max assist of 2 to come to sitting EOB and maintain, as pt with intermittent pushing posteriorly when wanting to lie back down. Pt needing max assist of 2 to stand and pivot to recliner with max multimodal cues throughout for upright posture and to sequence BLEs. Pt positioned to comfort in recliner and father present throughout session. Pt continues to benefit from skilled PT services to progress toward functional mobility goals.    Recommendations for follow up therapy are one component of a multi-disciplinary discharge planning process, led by the attending physician.  Recommendations may be updated based on patient status, additional functional criteria and insurance authorization.  Follow Up Recommendations  Skilled nursing-short term rehab (<3 hours/day) Can patient physically be transported by private vehicle: No   Assistance Recommended at Discharge Frequent or constant Supervision/Assistance  Patient can return home with the following Assistance with cooking/housework;Direct supervision/assist for medications management;Direct supervision/assist for financial management;Assist for transportation;Help with stairs or ramp for entrance;A lot of help with walking and/or transfers;A lot of help with bathing/dressing/bathroom   Equipment Recommendations  Other (comment);Wheelchair (measurements PT);Wheelchair cushion (measurements PT);BSC/3in1     Recommendations for Other Services       Precautions / Restrictions Precautions Precautions: Fall Restrictions Weight Bearing Restrictions: No     Mobility  Bed Mobility Overal bed mobility: Needs Assistance Bed Mobility: Supine to Sit     Supine to sit: Max assist, +2 for physical assistance, HOB elevated     General bed mobility comments: Requiring assist for legs off bed and to raise trunk, up to total assist to maintain sitting EOB as pt intermittently pushing posterior to lay down    Transfers Overall transfer level: Needs assistance Equipment used: 2 person hand held assist Transfers: Bed to chair/wheelchair/BSC, Sit to/from Stand Sit to Stand: Max assist, +2 safety/equipment, Total assist Stand pivot transfers: +2 safety/equipment, Max assist, Total assist         General transfer comment: Pt stood x 2 - good initiation from pt, max +2 to maintain with heavy multimodal cueing for upright standing and to step to recliner, pt with heavy sinking vertically/knee flexion when fatigued and wanting to sit but able to extend knees and come back up to stand with cues    Ambulation/Gait               General Gait Details: unable   Stairs             Wheelchair Mobility    Modified Rankin (Stroke Patients Only)       Balance Overall balance assessment: Needs assistance Sitting-balance support: No upper extremity supported, Feet supported, Bilateral upper extremity supported Sitting balance-Leahy Scale: Poor Sitting balance - Comments: EOB for 3-5 mins with min-max A varying; poor trunk control Postural control: Posterior lean (when wanting to lay back down)   Standing balance-Leahy Scale: Poor Standing balance comment: Max A, standing briefly for pivot and straightening pad  Cognition Arousal/Alertness: Awake/alert Behavior During Therapy: Flat affect, WFL for tasks assessed/performed Overall Cognitive  Status: History of cognitive impairments - at baseline                                 General Comments: Pt non-verbal during session.  able to follow some simple commands with cues.  Father present, helpful and supportive        Exercises General Exercises - Lower Extremity Long Arc Quad: AROM, AAROM, Right, Left, 5 reps, Seated    General Comments General comments (skin integrity, edema, etc.): VSS on 1L with SpO2 maintaining >92% throughout      Pertinent Vitals/Pain Pain Assessment Pain Assessment: No/denies pain Pain Intervention(s): Monitored during session    Home Living                          Prior Function            PT Goals (current goals can now be found in the care plan section) Acute Rehab PT Goals Patient Stated Goal: pt unable PT Goal Formulation: With patient/family Time For Goal Achievement: 12/01/21    Frequency    Min 3X/week      PT Plan      Co-evaluation PT/OT/SLP Co-Evaluation/Treatment: Yes Reason for Co-Treatment: Complexity of the patient's impairments (multi-system involvement);For patient/therapist safety;To address functional/ADL transfers PT goals addressed during session: Mobility/safety with mobility;Balance        AM-PAC PT "6 Clicks" Mobility   Outcome Measure  Help needed turning from your back to your side while in a flat bed without using bedrails?: A Lot Help needed moving from lying on your back to sitting on the side of a flat bed without using bedrails?: A Lot Help needed moving to and from a bed to a chair (including a wheelchair)?: Total Help needed standing up from a chair using your arms (e.g., wheelchair or bedside chair)?: Total Help needed to walk in hospital room?: Total Help needed climbing 3-5 steps with a railing? : Total 6 Click Score: 8    End of Session Equipment Utilized During Treatment: Oxygen Activity Tolerance: Patient tolerated treatment well;Patient limited by  fatigue Patient left: in chair;with call bell/phone within reach;with family/visitor present Nurse Communication: Mobility status;Need for lift equipment (maxi-move) PT Visit Diagnosis: Muscle weakness (generalized) (M62.81);Other abnormalities of gait and mobility (R26.89)     Time: 1610-9604 PT Time Calculation (min) (ACUTE ONLY): 43 min  Charges:  $Neuromuscular Re-education: 23-37 mins                     Beth Spackman R. PTA Acute Rehabilitation Services Office: [phone]    Catalina Antigua 11/24/2021, 11:43 AM

## 2021-11-24 NOTE — Care Management Important Message (Signed)
Important Message  Patient Details  Name: Todd Snyder MRN: [id number] Date of Birth: [birth year]   Medicare Important Message Given:  Yes     Renie Ora 11/24/2021, 9:00 AM

## 2021-11-25 ENCOUNTER — Inpatient Hospital Stay (HOSPITAL_COMMUNITY): Payer: Medicare Other

## 2021-11-25 DIAGNOSIS — A4151 Sepsis due to Escherichia coli [E. coli]: Secondary | ICD-10-CM | POA: Diagnosis not present

## 2021-11-25 DIAGNOSIS — J9601 Acute respiratory failure with hypoxia: Secondary | ICD-10-CM | POA: Diagnosis not present

## 2021-11-25 DIAGNOSIS — R6520 Severe sepsis without septic shock: Secondary | ICD-10-CM | POA: Diagnosis not present

## 2021-11-25 LAB — CBC WITH DIFFERENTIAL/PLATELET
Abs Immature Granulocytes: 0.03 10*3/uL (ref 0.00–0.07)
Basophils Absolute: 0.1 10*3/uL (ref 0.0–0.1)
Basophils Relative: 1 %
Eosinophils Absolute: 0.2 10*3/uL (ref 0.0–0.5)
Eosinophils Relative: 4 %
HCT: 34.7 % — ABNORMAL LOW (ref 39.0–52.0)
Hemoglobin: 11.2 g/dL — ABNORMAL LOW (ref 13.0–17.0)
Immature Granulocytes: 1 %
Lymphocytes Relative: 16 %
Lymphs Abs: 0.8 10*3/uL (ref 0.7–4.0)
MCH: 30.9 pg (ref 26.0–34.0)
MCHC: 32.3 g/dL (ref 30.0–36.0)
MCV: 95.6 fL (ref 80.0–100.0)
Monocytes Absolute: 0.4 10*3/uL (ref 0.1–1.0)
Monocytes Relative: 8 %
Neutro Abs: 3.7 10*3/uL (ref 1.7–7.7)
Neutrophils Relative %: 70 %
Platelets: 286 10*3/uL (ref 150–400)
RBC: 3.63 MIL/uL — ABNORMAL LOW (ref 4.22–5.81)
RDW: 13.2 % (ref 11.5–15.5)
WBC: 5.3 10*3/uL (ref 4.0–10.5)
nRBC: 0 % (ref 0.0–0.2)

## 2021-11-25 LAB — MAGNESIUM: Magnesium: 1.9 mg/dL (ref 1.7–2.4)

## 2021-11-25 LAB — COMPREHENSIVE METABOLIC PANEL
ALT: 74 U/L — ABNORMAL HIGH (ref 0–44)
AST: 37 U/L (ref 15–41)
Albumin: 2.4 g/dL — ABNORMAL LOW (ref 3.5–5.0)
Alkaline Phosphatase: 296 U/L — ABNORMAL HIGH (ref 38–126)
Anion gap: 8 (ref 5–15)
BUN: 18 mg/dL (ref 6–20)
CO2: 26 mmol/L (ref 22–32)
Calcium: 8.6 mg/dL — ABNORMAL LOW (ref 8.9–10.3)
Chloride: 111 mmol/L (ref 98–111)
Creatinine, Ser: 0.48 mg/dL — ABNORMAL LOW (ref 0.61–1.24)
GFR, Estimated: >60 mL/min (ref >60)
Glucose, Bld: 117 mg/dL — ABNORMAL HIGH (ref 70–99)
Potassium: 3.7 mmol/L (ref 3.5–5.1)
Sodium: 145 mmol/L (ref 135–145)
Total Bilirubin: 0.4 mg/dL (ref 0.3–1.2)
Total Protein: 6 g/dL — ABNORMAL LOW (ref 6.5–8.1)

## 2021-11-25 LAB — GLUCOSE, CAPILLARY
Glucose-Capillary: 101 mg/dL — ABNORMAL HIGH (ref 70–99)
Glucose-Capillary: 114 mg/dL — ABNORMAL HIGH (ref 70–99)
Glucose-Capillary: 121 mg/dL — ABNORMAL HIGH (ref 70–99)
Glucose-Capillary: 126 mg/dL — ABNORMAL HIGH (ref 70–99)
Glucose-Capillary: 127 mg/dL — ABNORMAL HIGH (ref 70–99)
Glucose-Capillary: 98 mg/dL (ref 70–99)

## 2021-11-25 LAB — BRAIN NATRIURETIC PEPTIDE: B Natriuretic Peptide: 8.9 pg/mL (ref 0.0–100.0)

## 2021-11-25 LAB — PROCALCITONIN: Procalcitonin: 0.17 ng/mL

## 2021-11-25 LAB — C-REACTIVE PROTEIN: CRP: 1.7 mg/dL — ABNORMAL HIGH (ref <1.0)

## 2021-11-25 LAB — PHOSPHORUS: Phosphorus: 3.4 mg/dL (ref 2.5–4.6)

## 2021-11-25 NOTE — Progress Notes (Signed)
CPT held at this time due to patient sitting in chair.

## 2021-11-25 NOTE — Progress Notes (Signed)
Physical Therapy Treatment Patient Details Name: Todd Snyder MRN: [id number] DOB: 1990/12/13 Today's Date: 11/25/2021   History of Present Illness Pt is a 31 y/o male presenting as a transfer from [facility] with septic shock due to LLL PNA, toxic encephalopatly. Pt intubated 8/13-8/23, Chest tube 8/19-8/24, reintubated 8/25-8/28.  PMHx: Autism and Bilateral club feet    PT Comments    Pt seen for skilled PT session  with continued focus on progression of functional OOB mobility. Pt continued to require max-total assist of 2 to come to sitting EOB, however pt able to maintain sitting without assist this session with close guard for safety. Pt continues to require max assist to stand and pivot to recliner with poor ability to sequence and follow commands during transfer. Session cut short with RT arrival for breathing treatment. Pt continues to benefit from skilled PT services to progress toward functional mobility goals.    Recommendations for follow up therapy are one component of a multi-disciplinary discharge planning process, led by the attending physician.  Recommendations may be updated based on patient status, additional functional criteria and insurance authorization.  Follow Up Recommendations  Skilled nursing-short term rehab (<3 hours/day) Can patient physically be transported by private vehicle: No   Assistance Recommended at Discharge Frequent or constant Supervision/Assistance  Patient can return home with the following Assistance with cooking/housework;Direct supervision/assist for medications management;Direct supervision/assist for financial management;Assist for transportation;Help with stairs or ramp for entrance;A lot of help with walking and/or transfers;A lot of help with bathing/dressing/bathroom   Equipment Recommendations  Other (comment);Wheelchair (measurements PT);Wheelchair cushion (measurements PT);BSC/3in1    Recommendations for Other Services        Precautions / Restrictions Precautions Precautions: Fall Precaution Comments: non-verbal Restrictions Weight Bearing Restrictions: No     Mobility  Bed Mobility Overal bed mobility: Needs Assistance Bed Mobility: Supine to Sit     Supine to sit: Total assist, +2 for safety/equipment     General bed mobility comments: Requiring assist for legs off bed and to raise trunk, pt able to maintain sitting EOB without assist this session with close guard for safety    Transfers Overall transfer level: Needs assistance Equipment used: 2 person hand held assist Transfers: Bed to chair/wheelchair/BSC, Sit to/from Stand Sit to Stand: Total assist, +2 physical assistance (decreased hip, trunk, and knee extension in standing.) Stand pivot transfers: +2 safety/equipment, Max assist, Total assist         General transfer comment: Pt needed total +2 for step pivot transfer to the recliner to maintain hip, trunk, and knee extension.  He would try to sit down frequently with standing requiring facilitation from therapists to maintain standing.  Pt able to step with cues this session to advance LE toward chair    Ambulation/Gait               General Gait Details: unable   Stairs             Wheelchair Mobility    Modified Rankin (Stroke Patients Only)       Balance Overall balance assessment: Needs assistance Sitting-balance support: No upper extremity supported, Feet supported, Bilateral upper extremity supported Sitting balance-Leahy Scale: Poor Sitting balance - Comments: able to maintain for short time without assist   Standing balance support: During functional activity Standing balance-Leahy Scale: Zero Standing balance comment: Pt needing two therapist assist for standing during this session.  Cognition Arousal/Alertness: Awake/alert Behavior During Therapy: Flat affect Overall Cognitive Status: History of cognitive  impairments - at baseline                                 General Comments: pt able to follow increased one-step commands this session, reaching and grabbing MTs hand when prompted        Exercises      General Comments General comments (skin integrity, edema, etc.): VSS on 1L      Pertinent Vitals/Pain Pain Assessment Pain Assessment: Faces Pain Score: 0-No pain Pain Intervention(s): Monitored during session    Home Living                          Prior Function            PT Goals (current goals can now be found in the care plan section) Acute Rehab PT Goals Patient Stated Goal: pt unable PT Goal Formulation: With patient/family Time For Goal Achievement: 12/01/21    Frequency    Min 3X/week      PT Plan      Co-evaluation              AM-PAC PT "6 Clicks" Mobility   Outcome Measure  Help needed turning from your back to your side while in a flat bed without using bedrails?: A Lot Help needed moving from lying on your back to sitting on the side of a flat bed without using bedrails?: A Lot Help needed moving to and from a bed to a chair (including a wheelchair)?: Total Help needed standing up from a chair using your arms (e.g., wheelchair or bedside chair)?: Total Help needed to walk in hospital room?: Total Help needed climbing 3-5 steps with a railing? : Total 6 Click Score: 8    End of Session Equipment Utilized During Treatment: Oxygen Activity Tolerance: Patient tolerated treatment well;Patient limited by fatigue Patient left: in chair;with call bell/phone within reach;Other (comment) (with RT present) Nurse Communication: Mobility status;Need for lift equipment (maxi-move) PT Visit Diagnosis: Muscle weakness (generalized) (M62.81);Other abnormalities of gait and mobility (R26.89)     Time: 0488-8916 PT Time Calculation (min) (ACUTE ONLY): 20 min  Charges:  $Therapeutic Activity: 8-22 mins                      Joliyah Lippens R. PTA Acute Rehabilitation Services Office: [phone]    Catalina Antigua 11/25/2021, 3:35 PM

## 2021-11-25 NOTE — Progress Notes (Signed)
Speech Language Pathology Treatment: Dysphagia;Cognitive-Linquistic  Patient Details Name: Todd Snyder MRN: [id number] DOB: 01-13-1991 Today's Date: 11/25/2021 Time: 1330-1350 SLP Time Calculation (min) (ACUTE ONLY): 20 min  Assessment / Plan / Recommendation Clinical Impression  Pt alert, tracking father and therapist. Pt with thick moist oral secretions. SLP able to retrieve secretions from base of tongue x2. Pt has weak congested cough at baseline and after chewing and swallowing ice. Pt is more attentive of ice; he accepts them easily and masticates with some tongue thrusting for oral transit. Swallow response immediately present. Pt accepted puree, but appeared to try to spit it out. Attempted sip of water on cup edge (father says he does not use straws) but pt unable to seal lips to cup edge or achieve any oral control with water, even with fed by father. Most water fell anteriorly, no swallow response noted. Recommend pt be given 1-2 ice chips an hour to hydrate oral mucosa and thin out secretions. Hopeful that pt will become better accustomed to oral intake over time. There is high risk of aspiration given severe weakness.   HPI HPI: 31 year old man who remains critically ill due to acute hypoxic respiratory failure secondary to likely aspiration pneumonia after suspected aspiration of emesis. Prolonged intubation (8/08-16-2023). 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 PMH includes aspiration PNA.      SLP Plan  Continue with current plan of care      Recommendations for follow up therapy are one component of a multi-disciplinary discharge planning process, led by the attending physician.  Recommendations may be updated based on patient status, additional functional criteria and insurance authorization.    Recommendations  Diet recommendations: NPO                Plan: Continue with current plan of care           Todd Snyder, Todd Snyder  11/25/2021,  2:18 PM

## 2021-11-25 NOTE — Progress Notes (Signed)
PROGRESS NOTE                                                                                                                                                                                                             Patient Demographics:    Todd Snyder, is a 31 y.o. male, DOB - 1991-01-26, VA:[id number]  Outpatient Primary MD for the patient is Patient, No Pcp Per    LOS - 1  Admit date - 11/10/2021    Chief Complaint  Patient presents with   Respiratory Distress       Brief Narrative (HPI from H&P)   31 year old gentleman with history of autism who was admitted on 11/03/2021 with aspiration pneumonia after ileus induced nausea vomiting, he developed sepsis, toxic encephalopathy with acute hypoxic respiratory failure, he was admitted by pulmonary critical care intubated and extubated twice.  Placed on empiric antibiotics.  Finally stabilized, currently minimally responsive, on NG tube feeds, finishing his IV antibiotic treatment.  Transferred to hospitalist service under my care on 11/23/2021.  He is currently DNR plan is to continue gentle medical treatment if there is any further significant decline in the future father who is the primary decision maker wants to focus on comfort measures.  Significant events and procedures.  8/13 admitted, intubated, bronched.  8/14 clinically no ileus or SBO, no NGT output, so TF started 8/19 CXR not better despite CPT & nebs. CT shows posterior predominant loculated effusion. Still not amenable to bedside placement due to placement of effusion. IR consulted for CT-guided chest tube. 8/23 extubated, continues to have secretions 8/24 chest tube removed 8/25 copious secretions overnight, hypoxia, intubated. Vomited post intubation  8/28 extubated, continues to have secretions 11/23/2021.  Transferred to hospitalist service under my care on day 17 of his hospital stay 11/23/2021.   Echocardiogram. 1. Left ventricular ejection fraction, by estimation, is 60 to 65%. The left ventricle has normal function. The left ventricle has no regional wall motion abnormalities. Left ventricular diastolic parameters were normal.  2. Right ventricular systolic function is normal. The right ventricular size is normal. Tricuspid regurgitation signal is inadequate for assessing PA pressure.  3. The mitral valve is normal in structure. No evidence of mitral valve regurgitation. No evidence of mitral stenosis.  4. The aortic valve is normal in structure. Aortic valve regurgitation is not visualized.  No aortic stenosis is present.  5. The inferior vena cava is normal in size with greater than 50% respiratory variability, suggesting right atrial pressure of 3 mmHg.   Subjective:    [name] today in bed at baseline does not talk, currently minimally responsive.  Unable to respond reliably to questions or commands.   Assessment  & Plan :   Septic shock due to E. coli aspiration pneumonia caused by nausea vomiting due to ileus.  Complicated by acute hypoxic respiratory failure, difficulty to maintain airway, intubation extubation twice.  Loculated left-sided pleural effusion and possible pneumothorax requiring CT-guided chest tube placement (removed).  Transferred to my service on 11/23/2021 on day 17 of hospital stay.  He has been admitted by PCCM, has been stabilized, currently has NG tube feeds, finishing 7-day course of IV antibiotics for E. coli pneumonia, chest tube for pleural effusion and possible pneumothorax was placed and finally removed on 11/17/2021.  Sepsis pathophysiology seems to have resolved, chest x-ray shows gradual improvement, no signs of a large pneumothorax.  Does have some pleural effusion at the bases.  At this time plan is to continue gentle medical treatment which includes if needed IV antibiotics, IV fluids, PT OT and speech treatment.  If there is any further significant  decline family/father who is the primary decision maker does not want reintubation on any further aggressive measures.  Wants to maintain DNR and transition to comfort measures if there is a significant decline in the future.  We will also involve palliative care for goals of care.   Toxic encephalopathy on top of underlying cerebral palsy.  Nonverbal at baseline.  Encephalopathy due to underlying infection and acute illness, supportive care.  No focal deficits.  Paroxysmal A-fib with RVR.  Caused by respiratory distress, initially required amiodarone drip, currently on beta-blocker.  [place] vasc 2 score of 1 or less.  No anticoagulation, stable TSH and baseline echocardiogram with preserved EF of 60%.  Dehydration with hypernatremia.  Continue free water flushes via NG tube, dose adjusted on 11/23/2021, gentle D5W IV given.  Resolved hypernatremia.  Left eye conjunctivitis.  Improving on eyedrops.  Generalized weakness, deconditioning due to acute illness and underlying cerebral palsy.  PT OT and speech eval, supportive care.  He does walk with assistance at home at times uses walker.      Condition - Extremely Guarded  Family Communication  : Father bedside in detail on 11/23/2021, confirms DNR.  Confirms continue present line of care.  If any significant decline then comfort measures.     Code Status :  DNR  Consults  : PCCM, palliative care  PUD Prophylaxis : PPI   Procedures  :            Disposition Plan  :    Status is: Inpatient  DVT Prophylaxis  :    heparin injection 5,000 Units Start: 11/11/21 1400 Place and maintain sequential compression device Start: 11/09/21 2201    Lab Results  Component Value Date   PLT 286 11/25/2021    Diet :  Diet Order             Diet NPO time specified  Diet effective now                    Inpatient Medications  Scheduled Meds:  Chlorhexidine Gluconate Cloth  6 each Topical Daily   fiber supplement (BANATROL TF)  60  mL Per Tube TID   free water  250  mL Per Tube Q6H   Gerhardt's butt cream  1 Application Topical BID   heparin injection (subcutaneous)  5,000 Units Subcutaneous Q8H   ipratropium-albuterol  3 mL Nebulization TID   metoprolol tartrate  25 mg Per Tube BID   mouth rinse  15 mL Mouth Rinse 4 times per day   pantoprazole  40 mg Per Tube Daily   Continuous Infusions:  sodium chloride 250 mL (11/21/21 0030)   feeding supplement (VITAL AF 1.2 CAL) 70 mL/hr at 11/25/21 0522   PRN Meds:.acetaminophen, loperamide, metoprolol tartrate, mouth rinse, white petrolatum  Time Spent in minutes  30   Lala Lund M.D on 11/25/2021 at 10:41 AM  To page go to [email or web address]   [facility] -  Office  [phone]  See all Orders from today for further details    Objective:   Vitals:   11/25/21 0334 11/25/21 0400 11/25/21 0832 11/25/21 0929  BP: 114/86 132/76 124/70   Pulse: (!) 116 (!) 108 (!) 119   Resp: 20 18 19    Temp: 98.7 F (37.1 C)  (!) 97.4 F (36.3 C)   TempSrc: Axillary  Axillary   SpO2: 98% 97% 97% 100%  Weight: 70.9 kg     Height:        Wt Readings from Last 3 Encounters:  11/25/21 70.9 kg     Intake/Output Summary (Last 24 hours) at 11/25/2021 1041 Last data filed at 11/25/2021 0522 Gross per 24 hour  Intake 1621.67 ml  Output 1245 ml  Net 376.67 ml     Physical Exam  Awake but not alert, does not talk at baseline, currently following few intermittent basic commands, NG tube in place, moves all 4 extremities to painful stimuli, No apparent focal neurological deficits [place].AT,PERRAL Supple Neck, No JVD,   Symmetrical Chest wall movement, Good air movement bilaterally, CTAB RRR,No Gallops, Rubs or new Murmurs,  +ve B.Sounds, Abd Soft, No tenderness,   No Cyanosis, Clubbing or edema      Data Review:    CBC Recent Labs  Lab 11/21/21 0446 11/22/21 0027 11/23/21 0837 11/24/21 0249 11/25/21 0414  WBC 5.8 7.6 5.6 5.7 5.3  HGB 9.5* 10.0* 10.3* 10.5*  11.2*  HCT 29.5* 31.4* 32.5* 33.1* 34.7*  PLT 290 346 323 306 286  MCV 96.4 96.6 96.4 95.9 95.6  MCH 31.0 30.8 30.6 30.4 30.9  MCHC 32.2 31.8 31.7 31.7 32.3  RDW 13.4 13.3 13.3 13.2 13.2  LYMPHSABS  --   --   --  0.8 0.8  MONOABS  --   --   --  0.4 0.4  EOSABS  --   --   --  0.2 0.2  BASOSABS  --   --   --  0.0 0.1    Electrolytes Recent Labs  Lab 11/21/21 0446 11/22/21 0027 11/23/21 0110 11/23/21 0837 11/24/21 0249 11/25/21 0414  NA 143 144  --  147* 142 145  K 3.8 3.8  --  3.8 3.8 3.7  CL 114* 113*  --  107 109 111  CO2 23 26  --  28 26 26   GLUCOSE 119* 101*  --  122* 120* 117*  BUN 16 15  --  16 13 18   CREATININE 0.44* 0.47*  --  0.50* 0.46* 0.48*  CALCIUM 7.7* 8.2*  --  8.8* 8.4* 8.6*  AST  --   --   --   --  47* 37  ALT  --   --   --   --  78* 74*  ALKPHOS  --   --   --   --  308* 296*  BILITOT  --   --   --   --  0.5 0.4  ALBUMIN  --   --   --   --  2.2* 2.4*  MG 1.8 2.0 1.9  --  1.9 1.9  CRP  --   --   --  3.9* 2.9* 1.7*  PROCALCITON  --   --   --  0.14 0.17 0.17  TSH  --   --   --  4.356  --   --   BNP  --   --   --   --  8.7 8.9    Radiology Reports DG Chest Port 1 View  Result Date: 11/25/2021 CLINICAL DATA:  31 year old male with respiratory distress. Pneumonia and pleural effusion. EXAM: PORTABLE CHEST 1 VIEW COMPARISON:  Portable chest 11/23/2021 and earlier. FINDINGS: Portable AP semi upright view at 0615 hours. Enteric feeding tube remains in place, appears looped in the stomach with tip now visible in the left upper quadrant. Unchanged moderate patchy and veiling left lower lung opacity. No superimposed pneumothorax. Stable right lung and mediastinal contours. Visualized tracheal air column is within normal limits. Dextroconvex scoliosis. No acute osseous abnormality identified. Paucity of bowel gas. IMPRESSION: 1. Stable ventilation since 11/23/2021 with ongoing patchy and veiling left lower lung opacity. No new cardiopulmonary abnormality. 2. Enteric  feeding tube looped in the stomach. Electronically Signed   By: Odessa Fleming M.D.   On: 11/25/2021 06:29   ECHOCARDIOGRAM COMPLETE  Result Date: 11/23/2021    ECHOCARDIOGRAM REPORT   Patient Name:   CHAYIM BIALAS Date of Exam: 11/23/2021 Medical Rec #:  [id number]       Height:       66.0 in Accession #:    [id number]      Weight:       155.6 lb Date of Birth:  11/25/1990       BSA:          1.798 m Patient Age:    31 years        BP:           104/63 mmHg Patient Gender: M               HR:           104 bpm. Exam Location:  Inpatient Procedure: 2D Echo, Cardiac Doppler and Color Doppler Indications:    CHF  History:        Patient has no prior history of Echocardiogram examinations.  Sonographer:    Gaynell Face Referring Phys: Heide Scales [facility] IMPRESSIONS  1. Left ventricular ejection fraction, by estimation, is 60 to 65%. The left ventricle has normal function. The left ventricle has no regional wall motion abnormalities. Left ventricular diastolic parameters were normal.  2. Right ventricular systolic function is normal. The right ventricular size is normal. Tricuspid regurgitation signal is inadequate for assessing PA pressure.  3. The mitral valve is normal in structure. No evidence of mitral valve regurgitation. No evidence of mitral stenosis.  4. The aortic valve is normal in structure. Aortic valve regurgitation is not visualized. No aortic stenosis is present.  5. The inferior vena cava is normal in size with greater than 50% respiratory variability, suggesting right atrial pressure of 3 mmHg. FINDINGS  Left Ventricle: Left ventricular ejection fraction, by estimation, is 60 to 65%. The left ventricle has normal  function. The left ventricle has no regional wall motion abnormalities. The left ventricular internal cavity size was normal in size. There is  no left ventricular hypertrophy. Left ventricular diastolic parameters were normal. Normal left ventricular filling pressure. Right Ventricle: The  right ventricular size is normal. No increase in right ventricular wall thickness. Right ventricular systolic function is normal. Tricuspid regurgitation signal is inadequate for assessing PA pressure. Left Atrium: Left atrial size was normal in size. Right Atrium: Right atrial size was normal in size. Pericardium: There is no evidence of pericardial effusion. Mitral Valve: The mitral valve is normal in structure. No evidence of mitral valve regurgitation. No evidence of mitral valve stenosis. Tricuspid Valve: The tricuspid valve is normal in structure. Tricuspid valve regurgitation is not demonstrated. No evidence of tricuspid stenosis. Aortic Valve: The aortic valve is normal in structure. Aortic valve regurgitation is not visualized. No aortic stenosis is present. Aortic valve mean gradient measures 3.0 mmHg. Aortic valve peak gradient measures 4.8 mmHg. Aortic valve area, by VTI measures 2.87 cm. Pulmonic Valve: The pulmonic valve was normal in structure. Pulmonic valve regurgitation is not visualized. No evidence of pulmonic stenosis. Aorta: The aortic root is normal in size and structure. Venous: The inferior vena cava is normal in size with greater than 50% respiratory variability, suggesting right atrial pressure of 3 mmHg. IAS/Shunts: No atrial level shunt detected by color flow Doppler.  LEFT VENTRICLE PLAX 2D LVIDd:         4.10 cm   Diastology LVIDs:         2.50 cm   LV e' medial:    11.90 cm/s LV PW:         0.90 cm   LV E/e' medial:  6.8 LV IVS:        1.10 cm   LV e' lateral:   8.38 cm/s LVOT diam:     2.10 cm   LV E/e' lateral: 9.6 LV SV:         45 LV SV Index:   25 LVOT Area:     3.46 cm  RIGHT VENTRICLE RV S prime:     15.90 cm/s TAPSE (M-mode): 1.9 cm LEFT ATRIUM             Index        RIGHT ATRIUM          Index LA diam:        2.90 cm 1.61 cm/m   RA Area:     8.68 cm LA Vol (A2C):   48.0 ml 26.70 ml/m  RA Volume:   16.20 ml 9.01 ml/m LA Vol (A4C):   45.3 ml 25.20 ml/m LA Biplane Vol:  47.5 ml 26.42 ml/m  AORTIC VALVE AV Area (Vmax):    2.75 cm AV Area (Vmean):   2.71 cm AV Area (VTI):     2.87 cm AV Vmax:           110.00 cm/s AV Vmean:          72.700 cm/s AV VTI:            0.158 m AV Peak Grad:      4.8 mmHg AV Mean Grad:      3.0 mmHg LVOT Vmax:         87.30 cm/s LVOT Vmean:        56.800 cm/s LVOT VTI:          0.131 m LVOT/AV VTI ratio: 0.83  AORTA Ao Root diam:  2.60 cm Ao Asc diam:  2.60 cm MITRAL VALVE MV Area (PHT): 5.97 cm    SHUNTS MV Decel Time: 127 msec    Systemic VTI:  0.13 m MV E velocity: 80.50 cm/s  Systemic Diam: 2.10 cm MV A velocity: 54.10 cm/s MV E/A ratio:  1.49 Mihai Croitoru MD Electronically signed by Sanda Klein MD Signature Date/Time: 11/23/2021/3:35:04 PM    Final    DG Chest Port 1 View  Result Date: 11/23/2021 CLINICAL DATA:  Respiratory distress. EXAM: PORTABLE CHEST 1 VIEW COMPARISON:  Chest x-ray dated November 20, 2021. FINDINGS: Interval removal of the endotracheal tube and large bore enteric tube. Feeding tube remains in place with the tip below the field of view. Patchy left basilar opacity persists, and appears mildly worsened medially. Unchanged small left pleural effusion. No pneumothorax. No acute osseous abnormality. IMPRESSION: 1. Mildly worsened left basilar atelectasis and/or infiltrate. 2. Unchanged small left pleural effusion. Electronically Signed   By: Titus Dubin M.D.   On: 11/23/2021 08:26

## 2021-11-25 DEATH — deceased

## 2021-11-26 DIAGNOSIS — R6520 Severe sepsis without septic shock: Secondary | ICD-10-CM | POA: Diagnosis not present

## 2021-11-26 DIAGNOSIS — A4151 Sepsis due to Escherichia coli [E. coli]: Secondary | ICD-10-CM | POA: Diagnosis not present

## 2021-11-26 DIAGNOSIS — J9601 Acute respiratory failure with hypoxia: Secondary | ICD-10-CM | POA: Diagnosis not present

## 2021-11-26 LAB — COMPREHENSIVE METABOLIC PANEL
ALT: 79 U/L — ABNORMAL HIGH (ref 0–44)
AST: 46 U/L — ABNORMAL HIGH (ref 15–41)
Albumin: 2.5 g/dL — ABNORMAL LOW (ref 3.5–5.0)
Alkaline Phosphatase: 301 U/L — ABNORMAL HIGH (ref 38–126)
Anion gap: 6 (ref 5–15)
BUN: 17 mg/dL (ref 6–20)
CO2: 30 mmol/L (ref 22–32)
Calcium: 8.6 mg/dL — ABNORMAL LOW (ref 8.9–10.3)
Chloride: 110 mmol/L (ref 98–111)
Creatinine, Ser: 0.5 mg/dL — ABNORMAL LOW (ref 0.61–1.24)
GFR, Estimated: >60 mL/min (ref >60)
Glucose, Bld: 109 mg/dL — ABNORMAL HIGH (ref 70–99)
Potassium: 3.8 mmol/L (ref 3.5–5.1)
Sodium: 146 mmol/L — ABNORMAL HIGH (ref 135–145)
Total Bilirubin: 0.2 mg/dL — ABNORMAL LOW (ref 0.3–1.2)
Total Protein: 6 g/dL — ABNORMAL LOW (ref 6.5–8.1)

## 2021-11-26 LAB — CBC WITH DIFFERENTIAL/PLATELET
Abs Immature Granulocytes: 0.03 10*3/uL (ref 0.00–0.07)
Basophils Absolute: 0.1 10*3/uL (ref 0.0–0.1)
Basophils Relative: 1 %
Eosinophils Absolute: 0.3 10*3/uL (ref 0.0–0.5)
Eosinophils Relative: 4 %
HCT: 34.5 % — ABNORMAL LOW (ref 39.0–52.0)
Hemoglobin: 11.2 g/dL — ABNORMAL LOW (ref 13.0–17.0)
Immature Granulocytes: 1 %
Lymphocytes Relative: 17 %
Lymphs Abs: 1 10*3/uL (ref 0.7–4.0)
MCH: 30.9 pg (ref 26.0–34.0)
MCHC: 32.5 g/dL (ref 30.0–36.0)
MCV: 95 fL (ref 80.0–100.0)
Monocytes Absolute: 0.5 10*3/uL (ref 0.1–1.0)
Monocytes Relative: 8 %
Neutro Abs: 4 10*3/uL (ref 1.7–7.7)
Neutrophils Relative %: 69 %
Platelets: 256 10*3/uL (ref 150–400)
RBC: 3.63 MIL/uL — ABNORMAL LOW (ref 4.22–5.81)
RDW: 13.3 % (ref 11.5–15.5)
WBC: 5.8 10*3/uL (ref 4.0–10.5)
nRBC: 0 % (ref 0.0–0.2)

## 2021-11-26 LAB — GLUCOSE, CAPILLARY
Glucose-Capillary: 102 mg/dL — ABNORMAL HIGH (ref 70–99)
Glucose-Capillary: 110 mg/dL — ABNORMAL HIGH (ref 70–99)
Glucose-Capillary: 116 mg/dL — ABNORMAL HIGH (ref 70–99)
Glucose-Capillary: 127 mg/dL — ABNORMAL HIGH (ref 70–99)
Glucose-Capillary: 134 mg/dL — ABNORMAL HIGH (ref 70–99)

## 2021-11-26 LAB — PHOSPHORUS: Phosphorus: 3.9 mg/dL (ref 2.5–4.6)

## 2021-11-26 LAB — C-REACTIVE PROTEIN: CRP: 1.5 mg/dL — ABNORMAL HIGH (ref <1.0)

## 2021-11-26 LAB — MAGNESIUM: Magnesium: 2 mg/dL (ref 1.7–2.4)

## 2021-11-26 LAB — PROCALCITONIN: Procalcitonin: 0.16 ng/mL

## 2021-11-26 LAB — BRAIN NATRIURETIC PEPTIDE: B Natriuretic Peptide: 8.6 pg/mL (ref 0.0–100.0)

## 2021-11-26 MED ORDER — POTASSIUM CL IN DEXTROSE 5% 20 MEQ/L IV SOLN
20.0000 meq | INTRAVENOUS | Status: AC
  Administered 2021-11-26 (×2): 20 meq via INTRAVENOUS
  Filled 2021-11-26 (×2): qty 1000

## 2021-11-26 NOTE — Progress Notes (Signed)
SLP Cancellation Note  Patient Details Name: JEDAIAH RATHBUN MRN: [id number] DOB: October 03, 1990   Cancelled treatment:       Reason Eval/Treat Not Completed: Other (comment). Pt refused by shaking head no to SLP and father   Claudine Mouton 11/26/2021, 2:31 PM

## 2021-11-26 NOTE — Progress Notes (Signed)
PROGRESS NOTE                                                                                                                                                                                                             Todd Snyder Demographics:    Todd Snyder, is a 31 y.o. male, DOB - 1990/07/25, [id number]  Outpatient Primary MD for the Todd Snyder is Todd Snyder, No Pcp Per    LOS - 28  Admit date - 11/04/2021    Chief Complaint  Todd Snyder presents with   Respiratory Distress       Brief Narrative (HPI from H&P)   31 year old gentleman with history of autism who was admitted on 11/12/2021 with aspiration pneumonia after ileus induced nausea vomiting, he developed sepsis, toxic encephalopathy with acute hypoxic respiratory failure, he was admitted by pulmonary critical care intubated and extubated twice.  Placed on empiric antibiotics.  Finally stabilized, currently minimally responsive, on NG tube feeds, finishing his IV antibiotic treatment.  Transferred to hospitalist service under my care on 11/23/2021.  He is currently DNR plan is to continue gentle medical treatment if there is any further significant decline in the future father who is the primary decision maker wants to focus on comfort measures.  Significant events and procedures.  8/13 admitted, intubated, bronched.  8/14 clinically no ileus or SBO, no NGT output, so TF started 8/19 CXR not better despite CPT & nebs. CT shows posterior predominant loculated effusion. Still not amenable to bedside placement due to placement of effusion. IR consulted for CT-guided chest tube. 8/23 extubated, continues to have secretions 8/24 chest tube removed 8/25 copious secretions overnight, hypoxia, intubated. Vomited post intubation  8/28 extubated, continues to have secretions 11/23/2021.  Transferred to hospitalist service under my care on day 17 of his hospital stay 11/23/2021.   Echocardiogram. 1. Left ventricular ejection fraction, by estimation, is 60 to 65%. The left ventricle has normal function. The left ventricle has no regional wall motion abnormalities. Left ventricular diastolic parameters were normal.  2. Right ventricular systolic function is normal. The right ventricular size is normal. Tricuspid regurgitation signal is inadequate for assessing PA pressure.  3. The mitral valve is normal in structure. No evidence of mitral valve regurgitation. No evidence of mitral stenosis.  4. The aortic valve is normal in structure. Aortic valve regurgitation is not visualized.  No aortic stenosis is present.  5. The inferior vena cava is normal in size with greater than 50% respiratory variability, suggesting right atrial pressure of 3 mmHg.   Subjective:    Todd Snyder today in bed at baseline does not talk, currently minimally responsive.  Unable to respond reliably to questions or commands.   Assessment  & Plan :   Septic shock due to E. coli aspiration pneumonia caused by nausea vomiting due to ileus.  Complicated by acute hypoxic respiratory failure, difficulty to maintain airway, intubation extubation twice.  Loculated left-sided pleural effusion and possible pneumothorax requiring CT-guided chest tube placement (removed).  Transferred to my service on 11/23/2021 on day 17 of hospital stay.  He has been admitted by PCCM, has been stabilized, currently has NG tube feeds, finishing 7-day course of IV antibiotics for E. coli pneumonia, chest tube for pleural effusion and possible pneumothorax was placed and finally removed on 11/17/2021.  Sepsis pathophysiology seems to have resolved, chest x-ray shows gradual improvement, no signs of a large pneumothorax.  Does have some pleural effusion at the bases.  At this time plan is to continue gentle medical treatment which includes if needed IV antibiotics, IV fluids, PT OT and speech treatment.  If there is any further significant  decline family/father who is the primary decision maker does not want reintubation on any further aggressive measures.  Wants to maintain DNR and transition to comfort measures if there is a significant decline in the future.  We will also involve palliative care for goals of care.   Toxic encephalopathy on top of underlying cerebral palsy.  Nonverbal at baseline.  Encephalopathy due to underlying infection and acute illness, supportive care.  No focal deficits.  Paroxysmal A-fib with RVR.  Caused by respiratory distress, initially required amiodarone drip, currently on beta-blocker.  [place] vasc 2 score of 1 or less.  No anticoagulation, stable TSH and baseline echocardiogram with preserved EF of 60%.  Dehydration with hypernatremia.  Continue free water flushes via NG tube, dose adjusted on 11/23/2021, gentle D5W IV given.  Resolved hypernatremia.  Left eye conjunctivitis.  Improving on eyedrops.  Generalized weakness, deconditioning due to acute illness and underlying cerebral palsy.  PT OT and speech eval, supportive care.  He does walk with assistance at home at times uses walker.   Long-term prognosis does not look good at this time, marginal recovery on a daily basis.  Continue to monitor with supportive care.      Condition - Extremely Guarded  Family Communication  : Father bedside in detail on 11/23/2021, confirms DNR.  Confirms continue present line of care.  If any significant decline then comfort measures.     Code Status :  DNR  Consults  : PCCM, palliative care  PUD Prophylaxis : PPI   Procedures  :            Disposition Plan  :    Status is: Inpatient  DVT Prophylaxis  :    heparin injection 5,000 Units Start: 11/11/21 1400 Place and maintain sequential compression device Start: 11/09/21 2201    Lab Results  Component Value Date   PLT 256 11/26/2021    Diet :  Diet Order             Diet NPO time specified Except for: Ice Chips  Diet effective now                     Inpatient Medications  Scheduled  Meds:  Chlorhexidine Gluconate Cloth  6 each Topical Daily   fiber supplement (BANATROL TF)  60 mL Per Tube TID   free water  250 mL Per Tube Q6H   Gerhardt's butt cream  1 Application Topical BID   heparin injection (subcutaneous)  5,000 Units Subcutaneous Q8H   ipratropium-albuterol  3 mL Nebulization TID   metoprolol tartrate  25 mg Per Tube BID   mouth rinse  15 mL Mouth Rinse 4 times per day   pantoprazole  40 mg Per Tube Daily   Continuous Infusions:  sodium chloride 250 mL (11/21/21 0030)   dextrose 5 % with KCl 20 mEq / L 20 mEq (11/26/21 0807)   feeding supplement (VITAL AF 1.2 CAL) 1,000 mL (11/25/21 1849)   PRN Meds:.acetaminophen, loperamide, metoprolol tartrate, mouth rinse, white petrolatum  Time Spent in minutes  30   Lala Lund M.D on 11/26/2021 at 10:58 AM  To page go to [email or web address]   [facility] -  Office  [phone]  See all Orders from today for further details    Objective:   Vitals:   11/26/21 0427 11/26/21 0450 11/26/21 0819 11/26/21 0906  BP:   124/71   Pulse:   (!) 102   Resp:   19   Temp:      TempSrc:   Other (Comment)   SpO2: 98%  96% 99%  Weight:  70.3 kg    Height:        Wt Readings from Last 3 Encounters:  11/26/21 70.3 kg     Intake/Output Summary (Last 24 hours) at 11/26/2021 1058 Last data filed at 11/26/2021 0420 Gross per 24 hour  Intake 0 ml  Output 1800 ml  Net -1800 ml     Physical Exam  Awake but not alert, does not talk at baseline, currently following few intermittent basic commands, NG tube in place, moves all 4 extremities to painful stimuli, No apparent focal neurological deficits [place].AT,PERRAL Supple Neck, No JVD,   Symmetrical Chest wall movement, Good air movement bilaterally, CTAB RRR,No Gallops, Rubs or new Murmurs,  +ve B.Sounds, Abd Soft, No tenderness,   No Cyanosis, Clubbing or edema      Data Review:    CBC Recent Labs   Lab 11/22/21 0027 11/23/21 0837 11/24/21 0249 11/25/21 0414 11/26/21 0208  WBC 7.6 5.6 5.7 5.3 5.8  HGB 10.0* 10.3* 10.5* 11.2* 11.2*  HCT 31.4* 32.5* 33.1* 34.7* 34.5*  PLT 346 323 306 286 256  MCV 96.6 96.4 95.9 95.6 95.0  MCH 30.8 30.6 30.4 30.9 30.9  MCHC 31.8 31.7 31.7 32.3 32.5  RDW 13.3 13.3 13.2 13.2 13.3  LYMPHSABS  --   --  0.8 0.8 1.0  MONOABS  --   --  0.4 0.4 0.5  EOSABS  --   --  0.2 0.2 0.3  BASOSABS  --   --  0.0 0.1 0.1    Electrolytes Recent Labs  Lab 11/22/21 0027 11/23/21 0110 11/23/21 0837 11/24/21 0249 11/25/21 0414 11/26/21 0208  NA 144  --  147* 142 145 146*  K 3.8  --  3.8 3.8 3.7 3.8  CL 113*  --  107 109 111 110  CO2 26  --  28 26 26 30   GLUCOSE 101*  --  122* 120* 117* 109*  BUN 15  --  16 13 18 17   CREATININE 0.47*  --  0.50* 0.46* 0.48* 0.50*  CALCIUM 8.2*  --  8.8* 8.4* 8.6* 8.6*  AST  --   --   --  47* 37 46*  ALT  --   --   --  78* 74* 79*  ALKPHOS  --   --   --  308* 296* 301*  BILITOT  --   --   --  0.5 0.4 0.2*  ALBUMIN  --   --   --  2.2* 2.4* 2.5*  MG 2.0 1.9  --  1.9 1.9 2.0  CRP  --   --  3.9* 2.9* 1.7* 1.5*  PROCALCITON  --   --  0.14 0.17 0.17 0.16  TSH  --   --  4.356  --   --   --   BNP  --   --   --  8.7 8.9 8.6    Radiology Reports DG Chest Port 1 View  Result Date: 11/25/2021 CLINICAL DATA:  31 year old male with respiratory distress. Pneumonia and pleural effusion. EXAM: PORTABLE CHEST 1 VIEW COMPARISON:  Portable chest 11/23/2021 and earlier. FINDINGS: Portable AP semi upright view at 0615 hours. Enteric feeding tube remains in place, appears looped in the stomach with tip now visible in the left upper quadrant. Unchanged moderate patchy and veiling left lower lung opacity. No superimposed pneumothorax. Stable right lung and mediastinal contours. Visualized tracheal air column is within normal limits. Dextroconvex scoliosis. No acute osseous abnormality identified. Paucity of bowel gas. IMPRESSION: 1. Stable  ventilation since 11/23/2021 with ongoing patchy and veiling left lower lung opacity. No new cardiopulmonary abnormality. 2. Enteric feeding tube looped in the stomach. Electronically Signed   By: Odessa Fleming M.D.   On: 11/25/2021 06:29   ECHOCARDIOGRAM COMPLETE  Result Date: 11/23/2021    ECHOCARDIOGRAM REPORT   Todd Snyder Name:   ERIBERTO FELCH Date of Exam: 11/23/2021 Medical Rec #:  [id number]       Height:       66.0 in Accession #:    [id number]      Weight:       155.6 lb Date of Birth:  1990/05/16       BSA:          1.798 m Todd Snyder Age:    31 years        BP:           104/63 mmHg Todd Snyder Gender: M               HR:           104 bpm. Exam Location:  Inpatient Procedure: 2D Echo, Cardiac Doppler and Color Doppler Indications:    CHF  History:        Todd Snyder has no prior history of Echocardiogram examinations.  Sonographer:    Gaynell Face Referring Phys: Heide Scales [facility] IMPRESSIONS  1. Left ventricular ejection fraction, by estimation, is 60 to 65%. The left ventricle has normal function. The left ventricle has no regional wall motion abnormalities. Left ventricular diastolic parameters were normal.  2. Right ventricular systolic function is normal. The right ventricular size is normal. Tricuspid regurgitation signal is inadequate for assessing PA pressure.  3. The mitral valve is normal in structure. No evidence of mitral valve regurgitation. No evidence of mitral stenosis.  4. The aortic valve is normal in structure. Aortic valve regurgitation is not visualized. No aortic stenosis is present.  5. The inferior vena cava is normal in size with greater than 50% respiratory variability, suggesting right atrial pressure of 3 mmHg. FINDINGS  Left Ventricle: Left ventricular ejection fraction, by estimation, is 60 to 65%. The left  ventricle has normal function. The left ventricle has no regional wall motion abnormalities. The left ventricular internal cavity size was normal in size. There is  no left  ventricular hypertrophy. Left ventricular diastolic parameters were normal. Normal left ventricular filling pressure. Right Ventricle: The right ventricular size is normal. No increase in right ventricular wall thickness. Right ventricular systolic function is normal. Tricuspid regurgitation signal is inadequate for assessing PA pressure. Left Atrium: Left atrial size was normal in size. Right Atrium: Right atrial size was normal in size. Pericardium: There is no evidence of pericardial effusion. Mitral Valve: The mitral valve is normal in structure. No evidence of mitral valve regurgitation. No evidence of mitral valve stenosis. Tricuspid Valve: The tricuspid valve is normal in structure. Tricuspid valve regurgitation is not demonstrated. No evidence of tricuspid stenosis. Aortic Valve: The aortic valve is normal in structure. Aortic valve regurgitation is not visualized. No aortic stenosis is present. Aortic valve mean gradient measures 3.0 mmHg. Aortic valve peak gradient measures 4.8 mmHg. Aortic valve area, by VTI measures 2.87 cm. Pulmonic Valve: The pulmonic valve was normal in structure. Pulmonic valve regurgitation is not visualized. No evidence of pulmonic stenosis. Aorta: The aortic root is normal in size and structure. Venous: The inferior vena cava is normal in size with greater than 50% respiratory variability, suggesting right atrial pressure of 3 mmHg. IAS/Shunts: No atrial level shunt detected by color flow Doppler.  LEFT VENTRICLE PLAX 2D LVIDd:         4.10 cm   Diastology LVIDs:         2.50 cm   LV e' medial:    11.90 cm/s LV PW:         0.90 cm   LV E/e' medial:  6.8 LV IVS:        1.10 cm   LV e' lateral:   8.38 cm/s LVOT diam:     2.10 cm   LV E/e' lateral: 9.6 LV SV:         45 LV SV Index:   25 LVOT Area:     3.46 cm  RIGHT VENTRICLE RV S prime:     15.90 cm/s TAPSE (M-mode): 1.9 cm LEFT ATRIUM             Index        RIGHT ATRIUM          Index LA diam:        2.90 cm 1.61 cm/m   RA  Area:     8.68 cm LA Vol (A2C):   48.0 ml 26.70 ml/m  RA Volume:   16.20 ml 9.01 ml/m LA Vol (A4C):   45.3 ml 25.20 ml/m LA Biplane Vol: 47.5 ml 26.42 ml/m  AORTIC VALVE AV Area (Vmax):    2.75 cm AV Area (Vmean):   2.71 cm AV Area (VTI):     2.87 cm AV Vmax:           110.00 cm/s AV Vmean:          72.700 cm/s AV VTI:            0.158 m AV Peak Grad:      4.8 mmHg AV Mean Grad:      3.0 mmHg LVOT Vmax:         87.30 cm/s LVOT Vmean:        56.800 cm/s LVOT VTI:          0.131 m LVOT/AV VTI ratio: 0.83  AORTA  Ao Root diam: 2.60 cm Ao Asc diam:  2.60 cm MITRAL VALVE MV Area (PHT): 5.97 cm    SHUNTS MV Decel Time: 127 msec    Systemic VTI:  0.13 m MV E velocity: 80.50 cm/s  Systemic Diam: 2.10 cm MV A velocity: 54.10 cm/s MV E/A ratio:  1.49 Mihai Croitoru MD Electronically signed by Sanda Klein MD Signature Date/Time: 11/23/2021/3:35:04 PM    Final    DG Chest Port 1 View  Result Date: 11/23/2021 CLINICAL DATA:  Respiratory distress. EXAM: PORTABLE CHEST 1 VIEW COMPARISON:  Chest x-ray dated November 20, 2021. FINDINGS: Interval removal of the endotracheal tube and large bore enteric tube. Feeding tube remains in place with the tip below the field of view. Patchy left basilar opacity persists, and appears mildly worsened medially. Unchanged small left pleural effusion. No pneumothorax. No acute osseous abnormality. IMPRESSION: 1. Mildly worsened left basilar atelectasis and/or infiltrate. 2. Unchanged small left pleural effusion. Electronically Signed   By: Titus Dubin M.D.   On: 11/23/2021 08:26

## 2021-11-26 NOTE — Progress Notes (Signed)
Patient has sleep apnea. O2 sats drop into low 80's and comes back up. Applied O2 at 2 liters. Patient still desats with O2 on. Cont. to monitor patient. No resp. Distress noted

## 2021-11-27 ENCOUNTER — Inpatient Hospital Stay (HOSPITAL_COMMUNITY): Payer: Medicare Other

## 2021-11-27 DIAGNOSIS — A4151 Sepsis due to Escherichia coli [E. coli]: Secondary | ICD-10-CM | POA: Diagnosis not present

## 2021-11-27 DIAGNOSIS — J9601 Acute respiratory failure with hypoxia: Secondary | ICD-10-CM | POA: Diagnosis not present

## 2021-11-27 DIAGNOSIS — R6520 Severe sepsis without septic shock: Secondary | ICD-10-CM | POA: Diagnosis not present

## 2021-11-27 LAB — CBC WITH DIFFERENTIAL/PLATELET
Abs Immature Granulocytes: 0.02 10*3/uL (ref 0.00–0.07)
Basophils Absolute: 0.1 10*3/uL (ref 0.0–0.1)
Basophils Relative: 1 %
Eosinophils Absolute: 0.4 10*3/uL (ref 0.0–0.5)
Eosinophils Relative: 6 %
HCT: 35.4 % — ABNORMAL LOW (ref 39.0–52.0)
Hemoglobin: 11.5 g/dL — ABNORMAL LOW (ref 13.0–17.0)
Immature Granulocytes: 0 %
Lymphocytes Relative: 16 %
Lymphs Abs: 1.2 10*3/uL (ref 0.7–4.0)
MCH: 30.9 pg (ref 26.0–34.0)
MCHC: 32.5 g/dL (ref 30.0–36.0)
MCV: 95.2 fL (ref 80.0–100.0)
Monocytes Absolute: 0.6 10*3/uL (ref 0.1–1.0)
Monocytes Relative: 8 %
Neutro Abs: 5.1 10*3/uL (ref 1.7–7.7)
Neutrophils Relative %: 69 %
Platelets: 249 10*3/uL (ref 150–400)
RBC: 3.72 MIL/uL — ABNORMAL LOW (ref 4.22–5.81)
RDW: 13.1 % (ref 11.5–15.5)
WBC: 7.3 10*3/uL (ref 4.0–10.5)
nRBC: 0 % (ref 0.0–0.2)

## 2021-11-27 LAB — COMPREHENSIVE METABOLIC PANEL
ALT: 89 U/L — ABNORMAL HIGH (ref 0–44)
AST: 52 U/L — ABNORMAL HIGH (ref 15–41)
Albumin: 2.6 g/dL — ABNORMAL LOW (ref 3.5–5.0)
Alkaline Phosphatase: 300 U/L — ABNORMAL HIGH (ref 38–126)
Anion gap: 7 (ref 5–15)
BUN: 15 mg/dL (ref 6–20)
CO2: 29 mmol/L (ref 22–32)
Calcium: 8.9 mg/dL (ref 8.9–10.3)
Chloride: 111 mmol/L (ref 98–111)
Creatinine, Ser: 0.47 mg/dL — ABNORMAL LOW (ref 0.61–1.24)
GFR, Estimated: >60 mL/min (ref >60)
Glucose, Bld: 116 mg/dL — ABNORMAL HIGH (ref 70–99)
Potassium: 4 mmol/L (ref 3.5–5.1)
Sodium: 147 mmol/L — ABNORMAL HIGH (ref 135–145)
Total Bilirubin: 0.3 mg/dL (ref 0.3–1.2)
Total Protein: 6.4 g/dL — ABNORMAL LOW (ref 6.5–8.1)

## 2021-11-27 LAB — MAGNESIUM: Magnesium: 2 mg/dL (ref 1.7–2.4)

## 2021-11-27 LAB — GLUCOSE, CAPILLARY
Glucose-Capillary: 101 mg/dL — ABNORMAL HIGH (ref 70–99)
Glucose-Capillary: 111 mg/dL — ABNORMAL HIGH (ref 70–99)
Glucose-Capillary: 118 mg/dL — ABNORMAL HIGH (ref 70–99)
Glucose-Capillary: 120 mg/dL — ABNORMAL HIGH (ref 70–99)
Glucose-Capillary: 121 mg/dL — ABNORMAL HIGH (ref 70–99)
Glucose-Capillary: 95 mg/dL (ref 70–99)
Glucose-Capillary: 97 mg/dL (ref 70–99)

## 2021-11-27 LAB — PROCALCITONIN: Procalcitonin: 0.16 ng/mL

## 2021-11-27 LAB — C-REACTIVE PROTEIN: CRP: 1.2 mg/dL — ABNORMAL HIGH (ref <1.0)

## 2021-11-27 LAB — PHOSPHORUS: Phosphorus: 3.5 mg/dL (ref 2.5–4.6)

## 2021-11-27 LAB — BRAIN NATRIURETIC PEPTIDE: B Natriuretic Peptide: 15.6 pg/mL (ref 0.0–100.0)

## 2021-11-27 MED ORDER — POTASSIUM CL IN DEXTROSE 5% 20 MEQ/L IV SOLN
20.0000 meq | INTRAVENOUS | Status: DC
  Administered 2021-11-27 (×2): 20 meq via INTRAVENOUS
  Filled 2021-11-27 (×3): qty 1000

## 2021-11-27 MED ORDER — PANCRELIPASE (LIP-PROT-AMYL) 10440-39150 UNITS PO TABS
20880.0000 [IU] | ORAL_TABLET | Freq: Once | ORAL | Status: AC
  Administered 2021-11-27: 20880 [IU]
  Filled 2021-11-27: qty 2

## 2021-11-27 MED ORDER — FREE WATER
350.0000 mL | Freq: Four times a day (QID) | Status: DC
  Administered 2021-11-27 – 2021-11-28 (×3): 350 mL

## 2021-11-27 MED ORDER — SODIUM BICARBONATE 650 MG PO TABS
650.0000 mg | ORAL_TABLET | Freq: Once | ORAL | Status: AC
  Administered 2021-11-27: 650 mg
  Filled 2021-11-27: qty 1

## 2021-11-27 NOTE — Progress Notes (Signed)
Mobility Specialist: Progress Note   11/27/21 1044  Mobility  Activity Dangled on edge of bed  Level of Assistance Moderate assist, patient does 50-74%  Assistive Device Other (Comment) (HHA)  Activity Response Tolerated fair  $Mobility charge 1 Mobility   Pt received in the bed and agreeable to mobility. Pt did well with following verbal cues for bed mobility. ModA with bed mobility but pt quick to lay back down. Pt assisted back to bed with call bell at his side.   [facility] Remy Dia Mobility Specialist Mobility Specialist [address]: [phone]

## 2021-11-27 NOTE — Progress Notes (Signed)
PROGRESS NOTE                                                                                                                                                                                                             Patient Demographics:    Todd Snyder, is a 31 y.o. male, DOB - 13-Jun-1990, VA:[id number]  Outpatient Primary MD for the patient is Patient, No Pcp Per    LOS - 21  Admit date - 11/11/2021    Chief Complaint  Patient presents with   Respiratory Distress       Brief Narrative (HPI from H&P)   31 year old gentleman with history of autism who was admitted on 10/26/2021 with aspiration pneumonia after ileus induced nausea vomiting, he developed sepsis, toxic encephalopathy with acute hypoxic respiratory failure, he was admitted by pulmonary critical care intubated and extubated twice.  Placed on empiric antibiotics.  Finally stabilized, currently minimally responsive, on NG tube feeds, finishing his IV antibiotic treatment.  Transferred to hospitalist service under my care on 11/23/2021.  He is currently DNR plan is to continue gentle medical treatment if there is any further significant decline in the future father who is the primary decision maker wants to focus on comfort measures.  Significant events and procedures.  8/13 admitted, intubated, bronched.  8/14 clinically no ileus or SBO, no NGT output, so TF started 8/19 CXR not better despite CPT & nebs. CT shows posterior predominant loculated effusion. Still not amenable to bedside placement due to placement of effusion. IR consulted for CT-guided chest tube. 8/23 extubated, continues to have secretions 8/24 chest tube removed 8/25 copious secretions overnight, hypoxia, intubated. Vomited post intubation  8/28 extubated, continues to have secretions 11/23/2021.  Transferred to hospitalist service under my care on day 17 of his hospital stay 11/23/2021.   Echocardiogram. 1. Left ventricular ejection fraction, by estimation, is 60 to 65%. The left ventricle has normal function. The left ventricle has no regional wall motion abnormalities. Left ventricular diastolic parameters were normal.  2. Right ventricular systolic function is normal. The right ventricular size is normal. Tricuspid regurgitation signal is inadequate for assessing PA pressure.  3. The mitral valve is normal in structure. No evidence of mitral valve regurgitation. No evidence of mitral stenosis.  4. The aortic valve is normal in structure. Aortic valve regurgitation is not visualized.  No aortic stenosis is present.  5. The inferior vena cava is normal in size with greater than 50% respiratory variability, suggesting right atrial pressure of 3 mmHg.   Subjective:    Todd Snyder today in bed at baseline does not talk, currently minimally responsive.  Unable to respond reliably to questions or commands.   Assessment  & Plan :   Septic shock due to E. coli aspiration pneumonia caused by nausea vomiting due to ileus.  Complicated by acute hypoxic respiratory failure, difficulty to maintain airway, intubation extubation twice.  Loculated left-sided pleural effusion and possible pneumothorax requiring CT-guided chest tube placement (removed).  Transferred to my service on 11/23/2021 on day 17 of hospital stay.  He has been admitted by PCCM, has been stabilized, currently has NG tube feeds, finishing 7-day course of IV antibiotics for E. coli pneumonia, chest tube for pleural effusion and possible pneumothorax was placed and finally removed on 11/17/2021.  Sepsis pathophysiology seems to have resolved, chest x-ray shows gradual improvement, no signs of a large pneumothorax.  Does have some pleural effusion at the bases.  At this time plan is to continue gentle medical treatment which includes if needed IV antibiotics, IV fluids, PT OT and speech treatment.  If there is any further significant  decline family/father who is the primary decision maker does not want reintubation on any further aggressive measures.  Wants to maintain DNR and transition to comfort measures if there is a significant decline in the future.  We will also involve palliative care for goals of care.   Toxic encephalopathy on top of underlying cerebral palsy.  Nonverbal at baseline.  Encephalopathy due to underlying infection and acute illness, supportive care.  No focal deficits.  Paroxysmal A-fib with RVR.  Caused by respiratory distress, initially required amiodarone drip, currently on beta-blocker.  Mali vasc 2 score of 1 or less.  No anticoagulation, stable TSH and baseline echocardiogram with preserved EF of 60%.  Dehydration with hypernatremia.  Continue free water flushes via NG tube, Free water dose adjusted on 11/27/2021, gentle D5W IV continue.  Resolved hypernatremia.  Left eye conjunctivitis.  Improving on eyedrops.  Generalized weakness, deconditioning due to acute illness and underlying cerebral palsy.  PT OT and speech eval, supportive care.  He does walk with assistance at home at times uses walker.   Long-term prognosis does not look good at this time, marginal recovery on a daily basis.  Continue to monitor with supportive care.      Condition - Extremely Guarded  Family Communication  : Father Todd Snyder [phone] bedside in detail on 11/23/2021, confirms DNR.  Confirms continue present line of care.  If any significant decline then comfort measures, over the phone on 11/27/21     Code Status :  DNR  Consults  : PCCM, palliative care  PUD Prophylaxis : PPI   Procedures  :            Disposition Plan  :    Status is: Inpatient  DVT Prophylaxis  :    heparin injection 5,000 Units Start: 11/11/21 1400 Place and maintain sequential compression device Start: 11/09/21 2201    Lab Results  Component Value Date   PLT 249 11/27/2021    Diet :  Diet Order             Diet NPO  time specified Except for: Ice Chips  Diet effective now  Inpatient Medications  Scheduled Meds:  Chlorhexidine Gluconate Cloth  6 each Topical Daily   fiber supplement (BANATROL TF)  60 mL Per Tube TID   free water  350 mL Per Tube Q6H   Gerhardt's butt cream  1 Application Topical BID   heparin injection (subcutaneous)  5,000 Units Subcutaneous Q8H   ipratropium-albuterol  3 mL Nebulization TID   lipase/protease/amylase)  20,880 Units Per Tube Once   And   sodium bicarbonate  650 mg Per Tube Once   metoprolol tartrate  25 mg Per Tube BID   mouth rinse  15 mL Mouth Rinse 4 times per day   pantoprazole  40 mg Per Tube Daily   Continuous Infusions:  sodium chloride 250 mL (11/21/21 0030)   dextrose 5 % with KCl 20 mEq / L 20 mEq (11/27/21 0635)   feeding supplement (VITAL AF 1.2 CAL) 70 mL/hr at 11/27/21 0630   PRN Meds:.acetaminophen, loperamide, metoprolol tartrate, mouth rinse, white petrolatum  Time Spent in minutes  30   Susa Raring M.D on 11/27/2021 at 10:04 AM  To page go to [email or web address]   [facility] -  Office  [phone]  See all Orders from today for further details    Objective:   Vitals:   11/27/21 0016 11/27/21 0316 11/27/21 0332 11/27/21 0849  BP: 118/73 121/74    Pulse: (!) 114 (!) 108    Resp: 20 18    Temp: 98.4 F (36.9 C) 99.3 F (37.4 C)    TempSrc: Oral Oral    SpO2: 96% 95%  92%  Weight:   67.1 kg   Height:        Wt Readings from Last 3 Encounters:  11/27/21 67.1 kg     Intake/Output Summary (Last 24 hours) at 11/27/2021 1004 Last data filed at 11/27/2021 0630 Gross per 24 hour  Intake 4753.9 ml  Output 2100 ml  Net 2653.9 ml     Physical Exam  Awake but not alert, does not talk at baseline, currently following few intermittent basic commands, NG tube in place, moves all 4 extremities to painful stimuli, No apparent focal neurological deficits [place].AT,PERRAL Supple Neck, No JVD,   Symmetrical  Chest wall movement, Good air movement bilaterally, CTAB RRR,No Gallops, Rubs or new Murmurs,  +ve B.Sounds, Abd Soft, No tenderness,   No Cyanosis, Clubbing or edema       Data Review:    CBC Recent Labs  Lab 11/23/21 0837 11/24/21 0249 11/25/21 0414 11/26/21 0208 11/27/21 0135  WBC 5.6 5.7 5.3 5.8 7.3  HGB 10.3* 10.5* 11.2* 11.2* 11.5*  HCT 32.5* 33.1* 34.7* 34.5* 35.4*  PLT 323 306 286 256 249  MCV 96.4 95.9 95.6 95.0 95.2  MCH 30.6 30.4 30.9 30.9 30.9  MCHC 31.7 31.7 32.3 32.5 32.5  RDW 13.3 13.2 13.2 13.3 13.1  LYMPHSABS  --  0.8 0.8 1.0 1.2  MONOABS  --  0.4 0.4 0.5 0.6  EOSABS  --  0.2 0.2 0.3 0.4  BASOSABS  --  0.0 0.1 0.1 0.1    Electrolytes Recent Labs  Lab 11/23/21 0110 11/23/21 0837 11/24/21 0249 11/25/21 0414 11/26/21 0208 11/27/21 0135  NA  --  147* 142 145 146* 147*  K  --  3.8 3.8 3.7 3.8 4.0  CL  --  107 109 111 110 111  CO2  --  28 26 26 30 29   GLUCOSE  --  122* 120* 117* 109* 116*  BUN  --  16 13  18 17 15   CREATININE  --  0.50* 0.46* 0.48* 0.50* 0.47*  CALCIUM  --  8.8* 8.4* 8.6* 8.6* 8.9  AST  --   --  47* 37 46* 52*  ALT  --   --  78* 74* 79* 89*  ALKPHOS  --   --  308* 296* 301* 300*  BILITOT  --   --  0.5 0.4 0.2* 0.3  ALBUMIN  --   --  2.2* 2.4* 2.5* 2.6*  MG 1.9  --  1.9 1.9 2.0 2.0  CRP  --  3.9* 2.9* 1.7* 1.5* 1.2*  PROCALCITON  --  0.14 0.17 0.17 0.16 0.16  TSH  --  4.356  --   --   --   --   BNP  --   --  8.7 8.9 8.6 15.6    Radiology Reports DG Chest Port 1 View  Result Date: 11/25/2021 CLINICAL DATA:  31 year old male with respiratory distress. Pneumonia and pleural effusion. EXAM: PORTABLE CHEST 1 VIEW COMPARISON:  Portable chest 11/23/2021 and earlier. FINDINGS: Portable AP semi upright view at 0615 hours. Enteric feeding tube remains in place, appears looped in the stomach with tip now visible in the left upper quadrant. Unchanged moderate patchy and veiling left lower lung opacity. No superimposed pneumothorax. Stable  right lung and mediastinal contours. Visualized tracheal air column is within normal limits. Dextroconvex scoliosis. No acute osseous abnormality identified. Paucity of bowel gas. IMPRESSION: 1. Stable ventilation since 11/23/2021 with ongoing patchy and veiling left lower lung opacity. No new cardiopulmonary abnormality. 2. Enteric feeding tube looped in the stomach. Electronically Signed   By: 11/25/2021 M.D.   On: 11/25/2021 06:29   ECHOCARDIOGRAM COMPLETE  Result Date: 11/23/2021    ECHOCARDIOGRAM REPORT   Patient Name:   LATWAN LUCHSINGER Date of Exam: 11/23/2021 Medical Rec #:  11/25/2021       Height:       66.0 in Accession #:    [id number]      Weight:       155.6 lb Date of Birth:  Sep 15, 1990       BSA:          1.798 m Patient Age:    31 years        BP:           104/63 mmHg Patient Gender: M               HR:           104 bpm. Exam Location:  Inpatient Procedure: 2D Echo, Cardiac Doppler and Color Doppler Indications:    CHF  History:        Patient has no prior history of Echocardiogram examinations.  Sonographer:    08/12/1988 Referring Phys: Gaynell Face [facility] IMPRESSIONS  1. Left ventricular ejection fraction, by estimation, is 60 to 65%. The left ventricle has normal function. The left ventricle has no regional wall motion abnormalities. Left ventricular diastolic parameters were normal.  2. Right ventricular systolic function is normal. The right ventricular size is normal. Tricuspid regurgitation signal is inadequate for assessing PA pressure.  3. The mitral valve is normal in structure. No evidence of mitral valve regurgitation. No evidence of mitral stenosis.  4. The aortic valve is normal in structure. Aortic valve regurgitation is not visualized. No aortic stenosis is present.  5. The inferior vena cava is normal in size with greater than 50% respiratory variability, suggesting right atrial pressure of  3 mmHg. FINDINGS  Left Ventricle: Left ventricular ejection fraction, by estimation,  is 60 to 65%. The left ventricle has normal function. The left ventricle has no regional wall motion abnormalities. The left ventricular internal cavity size was normal in size. There is  no left ventricular hypertrophy. Left ventricular diastolic parameters were normal. Normal left ventricular filling pressure. Right Ventricle: The right ventricular size is normal. No increase in right ventricular wall thickness. Right ventricular systolic function is normal. Tricuspid regurgitation signal is inadequate for assessing PA pressure. Left Atrium: Left atrial size was normal in size. Right Atrium: Right atrial size was normal in size. Pericardium: There is no evidence of pericardial effusion. Mitral Valve: The mitral valve is normal in structure. No evidence of mitral valve regurgitation. No evidence of mitral valve stenosis. Tricuspid Valve: The tricuspid valve is normal in structure. Tricuspid valve regurgitation is not demonstrated. No evidence of tricuspid stenosis. Aortic Valve: The aortic valve is normal in structure. Aortic valve regurgitation is not visualized. No aortic stenosis is present. Aortic valve mean gradient measures 3.0 mmHg. Aortic valve peak gradient measures 4.8 mmHg. Aortic valve area, by VTI measures 2.87 cm. Pulmonic Valve: The pulmonic valve was normal in structure. Pulmonic valve regurgitation is not visualized. No evidence of pulmonic stenosis. Aorta: The aortic root is normal in size and structure. Venous: The inferior vena cava is normal in size with greater than 50% respiratory variability, suggesting right atrial pressure of 3 mmHg. IAS/Shunts: No atrial level shunt detected by color flow Doppler.  LEFT VENTRICLE PLAX 2D LVIDd:         4.10 cm   Diastology LVIDs:         2.50 cm   LV e' medial:    11.90 cm/s LV PW:         0.90 cm   LV E/e' medial:  6.8 LV IVS:        1.10 cm   LV e' lateral:   8.38 cm/s LVOT diam:     2.10 cm   LV E/e' lateral: 9.6 LV SV:         45 LV SV Index:   25  LVOT Area:     3.46 cm  RIGHT VENTRICLE RV S prime:     15.90 cm/s TAPSE (M-mode): 1.9 cm LEFT ATRIUM             Index        RIGHT ATRIUM          Index LA diam:        2.90 cm 1.61 cm/m   RA Area:     8.68 cm LA Vol (A2C):   48.0 ml 26.70 ml/m  RA Volume:   16.20 ml 9.01 ml/m LA Vol (A4C):   45.3 ml 25.20 ml/m LA Biplane Vol: 47.5 ml 26.42 ml/m  AORTIC VALVE AV Area (Vmax):    2.75 cm AV Area (Vmean):   2.71 cm AV Area (VTI):     2.87 cm AV Vmax:           110.00 cm/s AV Vmean:          72.700 cm/s AV VTI:            0.158 m AV Peak Grad:      4.8 mmHg AV Mean Grad:      3.0 mmHg LVOT Vmax:         87.30 cm/s LVOT Vmean:        56.800 cm/s LVOT  VTI:          0.131 m LVOT/AV VTI ratio: 0.83  AORTA Ao Root diam: 2.60 cm Ao Asc diam:  2.60 cm MITRAL VALVE MV Area (PHT): 5.97 cm    SHUNTS MV Decel Time: 127 msec    Systemic VTI:  0.13 m MV E velocity: 80.50 cm/s  Systemic Diam: 2.10 cm MV A velocity: 54.10 cm/s MV E/A ratio:  1.49 Mihai Croitoru MD Electronically signed by Sanda Klein MD Signature Date/Time: 11/23/2021/3:35:04 PM    Final

## 2021-11-28 ENCOUNTER — Inpatient Hospital Stay (HOSPITAL_COMMUNITY): Payer: Medicare Other

## 2021-11-28 DIAGNOSIS — J9601 Acute respiratory failure with hypoxia: Secondary | ICD-10-CM | POA: Diagnosis not present

## 2021-11-28 DIAGNOSIS — A4151 Sepsis due to Escherichia coli [E. coli]: Secondary | ICD-10-CM | POA: Diagnosis not present

## 2021-11-28 DIAGNOSIS — R6520 Severe sepsis without septic shock: Secondary | ICD-10-CM | POA: Diagnosis not present

## 2021-11-28 LAB — CBC WITH DIFFERENTIAL/PLATELET
Abs Immature Granulocytes: 0.05 10*3/uL (ref 0.00–0.07)
Basophils Absolute: 0 10*3/uL (ref 0.0–0.1)
Basophils Relative: 0 %
Eosinophils Absolute: 0.2 10*3/uL (ref 0.0–0.5)
Eosinophils Relative: 1 %
HCT: 38.5 % — ABNORMAL LOW (ref 39.0–52.0)
Hemoglobin: 12.2 g/dL — ABNORMAL LOW (ref 13.0–17.0)
Immature Granulocytes: 0 %
Lymphocytes Relative: 4 %
Lymphs Abs: 0.6 10*3/uL — ABNORMAL LOW (ref 0.7–4.0)
MCH: 30.6 pg (ref 26.0–34.0)
MCHC: 31.7 g/dL (ref 30.0–36.0)
MCV: 96.5 fL (ref 80.0–100.0)
Monocytes Absolute: 0.9 10*3/uL (ref 0.1–1.0)
Monocytes Relative: 6 %
Neutro Abs: 12.7 10*3/uL — ABNORMAL HIGH (ref 1.7–7.7)
Neutrophils Relative %: 89 %
Platelets: 224 10*3/uL (ref 150–400)
RBC: 3.99 MIL/uL — ABNORMAL LOW (ref 4.22–5.81)
RDW: 13.2 % (ref 11.5–15.5)
WBC: 14.5 10*3/uL — ABNORMAL HIGH (ref 4.0–10.5)
nRBC: 0 % (ref 0.0–0.2)

## 2021-11-28 LAB — MAGNESIUM: Magnesium: 1.8 mg/dL (ref 1.7–2.4)

## 2021-11-28 LAB — GLUCOSE, CAPILLARY
Glucose-Capillary: 112 mg/dL — ABNORMAL HIGH (ref 70–99)
Glucose-Capillary: 116 mg/dL — ABNORMAL HIGH (ref 70–99)
Glucose-Capillary: 135 mg/dL — ABNORMAL HIGH (ref 70–99)
Glucose-Capillary: 135 mg/dL — ABNORMAL HIGH (ref 70–99)
Glucose-Capillary: 135 mg/dL — ABNORMAL HIGH (ref 70–99)
Glucose-Capillary: 140 mg/dL — ABNORMAL HIGH (ref 70–99)

## 2021-11-28 LAB — COMPREHENSIVE METABOLIC PANEL
ALT: 95 U/L — ABNORMAL HIGH (ref 0–44)
AST: 52 U/L — ABNORMAL HIGH (ref 15–41)
Albumin: 2.6 g/dL — ABNORMAL LOW (ref 3.5–5.0)
Alkaline Phosphatase: 287 U/L — ABNORMAL HIGH (ref 38–126)
Anion gap: 7 (ref 5–15)
BUN: 12 mg/dL (ref 6–20)
CO2: 29 mmol/L (ref 22–32)
Calcium: 8.6 mg/dL — ABNORMAL LOW (ref 8.9–10.3)
Chloride: 105 mmol/L (ref 98–111)
Creatinine, Ser: 0.51 mg/dL — ABNORMAL LOW (ref 0.61–1.24)
GFR, Estimated: >60 mL/min (ref >60)
Glucose, Bld: 121 mg/dL — ABNORMAL HIGH (ref 70–99)
Potassium: 4.2 mmol/L (ref 3.5–5.1)
Sodium: 141 mmol/L (ref 135–145)
Total Bilirubin: 0.5 mg/dL (ref 0.3–1.2)
Total Protein: 6 g/dL — ABNORMAL LOW (ref 6.5–8.1)

## 2021-11-28 LAB — PROCALCITONIN: Procalcitonin: 0.25 ng/mL

## 2021-11-28 LAB — PHOSPHORUS: Phosphorus: 4.3 mg/dL (ref 2.5–4.6)

## 2021-11-28 MED ORDER — ACETYLCYSTEINE 20 % IN SOLN
4.0000 mL | Freq: Once | RESPIRATORY_TRACT | Status: AC
  Administered 2021-11-28: 4 mL via RESPIRATORY_TRACT
  Filled 2021-11-28 (×2): qty 4

## 2021-11-28 MED ORDER — ALBUTEROL SULFATE (2.5 MG/3ML) 0.083% IN NEBU
2.5000 mg | INHALATION_SOLUTION | Freq: Once | RESPIRATORY_TRACT | Status: AC
  Administered 2021-11-28: 2.5 mg via RESPIRATORY_TRACT
  Filled 2021-11-28: qty 3

## 2021-11-28 MED ORDER — AMPICILLIN-SULBACTAM SODIUM 3 (2-1) G IJ SOLR
3.0000 g | Freq: Four times a day (QID) | INTRAMUSCULAR | Status: DC
  Administered 2021-11-28 – 2021-11-29 (×4): 3 g via INTRAVENOUS
  Filled 2021-11-28 (×4): qty 8

## 2021-11-28 MED ORDER — FUROSEMIDE 10 MG/ML IJ SOLN
INTRAMUSCULAR | Status: AC
  Administered 2021-11-28: 60 mg
  Filled 2021-11-28: qty 8

## 2021-11-28 MED ORDER — POTASSIUM CL IN DEXTROSE 5% 20 MEQ/L IV SOLN
20.0000 meq | INTRAVENOUS | Status: DC
  Administered 2021-11-28: 20 meq via INTRAVENOUS
  Filled 2021-11-28: qty 1000

## 2021-11-28 NOTE — Progress Notes (Signed)
NG tube was found to be half out of patient's nare.  Rapid Response nurse removed.  Per Dr. Thedore Mins, turn off all fluids, give 60mg  of Lasix, put patient on non-rebreather oxygen mask.  Patient's father informed.

## 2021-11-28 NOTE — TOC Progression Note (Signed)
Transition of Care [facility]) - Progression Note    Patient Details  Name: Todd Snyder MRN: [id number] Date of Birth: 05-Aug-1990  Transition of Care [facility]) CM/SW Contact  Eduard Roux, [place] Phone Number: 11/28/2021, 1:33 PM  Clinical Narrative:     TOC continues to follow for disposition needs.       Expected Discharge Plan and Services                                                 Social Determinants of Health (SDOH) Interventions    Readmission Risk Interventions     No data to display

## 2021-11-28 NOTE — Progress Notes (Signed)
Speech Language Pathology Treatment: Dysphagia  Patient Details Name: Todd Snyder MRN: [id number] DOB: 06-Apr-1990 Today's Date: 11/28/2021 Time: 3785-8850 SLP Time Calculation (min) (ACUTE ONLY): 11 min  Assessment / Plan / Recommendation Clinical Impression  No progress today. Pt continues to have weak constant coughing on audible pharyngea secretions. Cough is not productive. SLP suctioned pt with moderate removal of clear secretions, but pt sounded just as congested after suctioning. Though pt didn't open mouth for suctioning, he didn't try to move my hand away. However, when slp offered pt POs, he consistently pushed cups, spoons and ice cream containers away. SLP verbally encouraged pt and did successfully touch spoons with nectar thick juice to pts lips, hopeful that the taste of something sweet would interest him. Pt did exhibit some lingual thrusting, but didn't swallow tastes of liquids and continued to push everything away. Cannot even recommend comfort feeding as pt is resistant to any PO. Aspiration risk and risk of inadequate nutrition is almost assured at this time. Will continue efforts.   HPI HPI: 31 year old man who remains critically ill due to acute hypoxic respiratory failure secondary to likely aspiration pneumonia after suspected aspiration of emesis. Prolonged intubation (11/01/17/2023). 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 PMH includes aspiration PNA.      SLP Plan  Continue with current plan of care      Recommendations for follow up therapy are one component of a multi-disciplinary discharge planning process, led by the attending physician.  Recommendations may be updated based on patient status, additional functional criteria and insurance authorization.    Recommendations  Diet recommendations: NPO                Oral Care Recommendations: Oral care QID Follow Up Recommendations: Skilled nursing-short term rehab (<3 hours/day) Plan:  Continue with current plan of care           Roseanne Juenger, [name]  11/28/2021, 1:56 PM

## 2021-11-28 NOTE — Progress Notes (Signed)
Per overnight RN, patient removed his NG tube around 0400.   Per Dr. Thedore Mins, place another 14 Fr NG tube and hopefully patient will pass his swallow eval with speech tomorrow.  This will allow [place] to keep his NG in place instead of placing a CoreTrack.

## 2021-11-28 NOTE — Significant Event (Signed)
Rapid Response Event Note   Reason for Call :  Respiratory distress, desat  Initial Focused Assessment:  Patient is alert and lying in the bed.  He has increased work of breathing and is using accessory muscles to breath.  Lung sounds with rhonchi through out.  BP 130/79  HR 105-115  RR 35-40  O2 sat 79% on NRB  RN spoke with MD and father via phone  Interventions:  Placed additional O2:  He is now on 15L [place] and 100% NRB  O2 sats 68-78% RT has NT suctioned him in the past few minutes, thick secretions. TF stopped,  NG noted to be partially out,  removed. 60mg  Lasix given IV IVF stopped Patient repositioned a couple of times, Head of bed elevated.  After these interventions, no escalation of care Per MD and Father.  About the same time his Father arrived his O2 sats improved to low 90s.  He is still breathing rapidly but has slightly improved breathing pattern.  Plan of Care:  Transition to comfort if he continues to decline.     Event Summary:   MD Notified:  Call Time: 1525 Arrival Time: 1528 End Time:  1645  1529, RN

## 2021-11-28 NOTE — Progress Notes (Signed)
PROGRESS NOTE                                                                                                                                                                                                             Patient Demographics:    Todd Snyder, is a 31 y.o. male, DOB - Aug 21, 1990, QIO:[id number]  Outpatient Primary MD for the patient is Patient, No Pcp Per    LOS - 22  Admit date - 11/23/2021    Chief Complaint  Patient presents with   Respiratory Distress       Brief Narrative (HPI from H&P)   31 year old gentleman with history of autism who was admitted on 11/04/2021 with aspiration pneumonia after ileus induced nausea vomiting, he developed sepsis, toxic encephalopathy with acute hypoxic respiratory failure, he was admitted by pulmonary critical care intubated and extubated twice.  Placed on empiric antibiotics.  Finally stabilized, currently minimally responsive, on NG tube feeds, finishing his IV antibiotic treatment.  Transferred to hospitalist service under my care on 11/23/2021.  He is currently DNR plan is to continue gentle medical treatment if there is any further significant decline in the future father who is the primary decision maker wants to focus on comfort measures.  Significant events and procedures.  8/13 admitted, intubated, bronched.  8/14 clinically no ileus or SBO, no NGT output, so TF started 8/19 CXR not better despite CPT & nebs. CT shows posterior predominant loculated effusion. Still not amenable to bedside placement due to placement of effusion. IR consulted for CT-guided chest tube. 8/23 extubated, continues to have secretions 8/24 chest tube removed 8/25 copious secretions overnight, hypoxia, intubated. Vomited post intubation  8/28 extubated, continues to have secretions 11/23/2021.  Transferred to hospitalist service under my care on day 17 of his hospital stay 11/23/2021.   Echocardiogram. 1. Left ventricular ejection fraction, by estimation, is 60 to 65%. The left ventricle has normal function. The left ventricle has no regional wall motion abnormalities. Left ventricular diastolic parameters were normal.  2. Right ventricular systolic function is normal. The right ventricular size is normal. Tricuspid regurgitation signal is inadequate for assessing PA pressure.  3. The mitral valve is normal in structure. No evidence of mitral valve regurgitation. No evidence of mitral stenosis.  4. The aortic valve is normal in structure. Aortic valve regurgitation is not visualized.  No aortic stenosis is present.  5. The inferior vena cava is normal in size with greater than 50% respiratory variability, suggesting right atrial pressure of 3 mmHg.   Subjective:    [name] today in bed at baseline does not talk, currently minimally responsive.  Unable to respond reliably to questions or commands.   Assessment  & Plan :   Septic shock due to E. coli aspiration pneumonia caused by nausea vomiting due to ileus.  Complicated by acute hypoxic respiratory failure, difficulty to maintain airway, intubation extubation twice.  Loculated left-sided pleural effusion and possible pneumothorax requiring CT-guided chest tube placement (removed).  Transferred to my service on 11/23/2021 on day 17 of hospital stay.  He has been admitted by PCCM, has been stabilized, currently has NG tube feeds, finishing 7-day course of IV antibiotics for E. coli pneumonia, chest tube for pleural effusion and possible pneumothorax was placed and finally removed on 11/17/2021.  Sepsis pathophysiology seems to have resolved, chest x-ray shows gradual improvement, no signs of a large pneumothorax.  Does have some pleural effusion at the bases.  At this time plan is to continue gentle medical treatment which includes if needed IV antibiotics, IV fluids, PT OT and speech treatment.  Unfortunately he pulled his NG tube out  by mistake on 11/28/2021 and will be replaced, also clinical evidence of repeat aspiration pneumonia, trial of Unasyn.  Hopefully he will pass his swallow study soon and we will take off the NG tube in the next 1 to 2 days.  However if there is any further significant decline family/father who is the primary decision maker does not want reintubation on any further aggressive measures.  Wants to maintain DNR and transition to comfort measures if there is a significant decline in the future.  We will also involve palliative care for goals of care.   Toxic encephalopathy on top of underlying cerebral palsy.  Nonverbal at baseline.  Encephalopathy due to underlying infection and acute illness, supportive care.  No focal deficits.  Paroxysmal A-fib with RVR.  Caused by respiratory distress, initially required amiodarone drip, currently on beta-blocker.  Mali vasc 2 score of 1 or less.  No anticoagulation, stable TSH and baseline echocardiogram with preserved EF of 60%.  Dehydration with hypernatremia.  Continue free water flushes via NG tube, Free water dose adjusted on 11/27/2021, gentle D5W IV continue.  Resolved hypernatremia.  Left eye conjunctivitis.  Improving on eyedrops.  Generalized weakness, deconditioning due to acute illness and underlying cerebral palsy.  PT OT and speech eval, supportive care.  He does walk with assistance at home at times uses walker.   Long-term prognosis does not look good at this time, marginal recovery on a daily basis.  Continue to monitor with supportive care.      Condition - Extremely Guarded  Family Communication  : Father Joneen Boers [phone] bedside in detail on 11/23/2021, confirms DNR.  Confirms continue present line of care.  If any significant decline then comfort measures, over the phone on 11/27/21     Code Status :  DNR  Consults  : PCCM, palliative care  PUD Prophylaxis : PPI   Procedures  :            Disposition Plan  :    Status is:  Inpatient  DVT Prophylaxis  :    heparin injection 5,000 Units Start: 11/11/21 1400 Place and maintain sequential compression device Start: 11/09/21 2201    Lab Results  Component Value Date  PLT 224 11/28/2021    Diet :  Diet Order             Diet NPO time specified Except for: Ice Chips  Diet effective now                    Inpatient Medications  Scheduled Meds:  Chlorhexidine Gluconate Cloth  6 each Topical Daily   fiber supplement (BANATROL TF)  60 mL Per Tube TID   free water  350 mL Per Tube Q6H   Gerhardt's butt cream  1 Application Topical BID   heparin injection (subcutaneous)  5,000 Units Subcutaneous Q8H   ipratropium-albuterol  3 mL Nebulization TID   metoprolol tartrate  25 mg Per Tube BID   mouth rinse  15 mL Mouth Rinse 4 times per day   pantoprazole  40 mg Per Tube Daily   Continuous Infusions:  sodium chloride 250 mL (11/21/21 0030)   feeding supplement (VITAL AF 1.2 CAL) 70 mL/hr at 11/28/21 0415   PRN Meds:.acetaminophen, loperamide, metoprolol tartrate, mouth rinse, white petrolatum  Time Spent in minutes  30   Lala Lund M.D on 11/28/2021 at 10:42 AM  To page go to [email or web address]   [facility] -  Office  [phone]  See all Orders from today for further details    Objective:   Vitals:   11/28/21 0757 11/28/21 0830 11/28/21 0858 11/28/21 0905  BP: 124/70     Pulse: (!) 123     Resp: (!) 22     Temp: 98.5 F (36.9 C)     TempSrc: Oral     SpO2: 99% 99% 100% 94%  Weight:      Height:        Wt Readings from Last 3 Encounters:  11/27/21 67.1 kg     Intake/Output Summary (Last 24 hours) at 11/28/2021 1042 Last data filed at 11/28/2021 0415 Gross per 24 hour  Intake 3371 ml  Output 3200 ml  Net 171 ml     Physical Exam  Awake but not alert, does not talk at baseline, currently following few intermittent basic commands, moves all 4 extremities to painful stimuli, No apparent focal neurological  deficits [place].AT,PERRAL Supple Neck, No JVD,   Symmetrical Chest wall movement, Good air movement bilaterally, CTAB RRR,No Gallops, Rubs or new Murmurs,  +ve B.Sounds, Abd Soft, No tenderness,   No Cyanosis, Clubbing or edema        Data Review:    CBC Recent Labs  Lab 11/24/21 0249 11/25/21 0414 11/26/21 0208 11/27/21 0135 11/28/21 0810  WBC 5.7 5.3 5.8 7.3 14.5*  HGB 10.5* 11.2* 11.2* 11.5* 12.2*  HCT 33.1* 34.7* 34.5* 35.4* 38.5*  PLT 306 286 256 249 224  MCV 95.9 95.6 95.0 95.2 96.5  MCH 30.4 30.9 30.9 30.9 30.6  MCHC 31.7 32.3 32.5 32.5 31.7  RDW 13.2 13.2 13.3 13.1 13.2  LYMPHSABS 0.8 0.8 1.0 1.2 0.6*  MONOABS 0.4 0.4 0.5 0.6 0.9  EOSABS 0.2 0.2 0.3 0.4 0.2  BASOSABS 0.0 0.1 0.1 0.1 0.0    Electrolytes Recent Labs  Lab 11/23/21 0837 11/24/21 0249 11/25/21 0414 11/26/21 0208 11/27/21 0135 11/28/21 0810  NA 147* 142 145 146* 147* 141  K 3.8 3.8 3.7 3.8 4.0 4.2  CL 107 109 111 110 111 105  CO2 28 26 26 30 29 29   GLUCOSE 122* 120* 117* 109* 116* 121*  BUN 16 13 18 17 15 12   CREATININE 0.50* 0.46* 0.48* 0.50* 0.47*  0.51*  CALCIUM 8.8* 8.4* 8.6* 8.6* 8.9 8.6*  AST  --  47* 37 46* 52* 52*  ALT  --  78* 74* 79* 89* 95*  ALKPHOS  --  308* 296* 301* 300* 287*  BILITOT  --  0.5 0.4 0.2* 0.3 0.5  ALBUMIN  --  2.2* 2.4* 2.5* 2.6* 2.6*  MG  --  1.9 1.9 2.0 2.0 1.8  CRP 3.9* 2.9* 1.7* 1.5* 1.2*  --   PROCALCITON 0.14 0.17 0.17 0.16 0.16 0.25  TSH 4.356  --   --   --   --   --   BNP  --  8.7 8.9 8.6 15.6  --     Radiology Reports DG Chest Port 1 View  Result Date: 11/28/2021 CLINICAL DATA:  Shortness of breath. EXAM: PORTABLE CHEST 1 VIEW COMPARISON:  11/27/2021 FINDINGS: Persistent left base collapse/consolidation with probable left effusion. Diffuse interstitial opacity suggests edema. Cardiopericardial silhouette is at upper limits of normal for size. The visualized bony structures of the thorax are unremarkable. Telemetry leads overlie the chest. IMPRESSION:  Persistent left base collapse/consolidation with probable left effusion. Suspicion for interstitial pulmonary edema. Electronically Signed   By: Kennith Center M.D.   On: 11/28/2021 08:59   DG CHEST PORT 1 VIEW  Result Date: 11/27/2021 CLINICAL DATA:  [id number].  Nasogastric tube placement EXAM: PORTABLE CHEST 1 VIEW COMPARISON:  None Available. FINDINGS: Interval placement an enteric tube with tip and side port coursing below the hemidiaphragm overlying the expected region of the gastric lumen. Nonvisualization of the left heart border due to silhouetting off. Otherwise the heart and mediastinal contours are within normal limits. Left base airspace opacity. Streaky airspace opacity at the right lung base likely represents atelectasis. No pulmonary edema. Persistent at least small left pleural effusion. No pneumothorax. No acute osseous abnormality. IMPRESSION: 1. Enteric tube in good position. 2. Persistent at least small volume left pleural effusion with associated left base airspace opacity. Electronically Signed   By: Tish Frederickson M.D.   On: 11/27/2021 17:36   DG Abd Portable 1V  Result Date: 11/27/2021 CLINICAL DATA:  NG tube placement EXAM: PORTABLE ABDOMEN - 1 VIEW COMPARISON:  11/09/2021 FINDINGS: Nonobstructive pattern of included bowel gas. Non weighted enteric feeding tube is positioned with tip in the vicinity of the duodenal jejunal junction. No free air. IMPRESSION: Non weighted enteric feeding tube is positioned with tip in the vicinity of the duodenal jejunal junction. Electronically Signed   By: Jearld Lesch M.D.   On: 11/27/2021 13:03   DG Chest Port 1 View  Result Date: 11/25/2021 CLINICAL DATA:  31 year old male with respiratory distress. Pneumonia and pleural effusion. EXAM: PORTABLE CHEST 1 VIEW COMPARISON:  Portable chest 11/23/2021 and earlier. FINDINGS: Portable AP semi upright view at 0615 hours. Enteric feeding tube remains in place, appears looped in the stomach with tip now  visible in the left upper quadrant. Unchanged moderate patchy and veiling left lower lung opacity. No superimposed pneumothorax. Stable right lung and mediastinal contours. Visualized tracheal air column is within normal limits. Dextroconvex scoliosis. No acute osseous abnormality identified. Paucity of bowel gas. IMPRESSION: 1. Stable ventilation since 11/23/2021 with ongoing patchy and veiling left lower lung opacity. No new cardiopulmonary abnormality. 2. Enteric feeding tube looped in the stomach. Electronically Signed   By: Odessa Fleming M.D.   On: 11/25/2021 06:29

## 2021-11-28 NOTE — Progress Notes (Signed)
Pharmacy Antibiotic Note  Todd Snyder is a 31 y.o. male admitted on 10/26/2021 with  pneumonia .  Patient previously received 7 days of CTX (end date 8/30) and 9 days of Zosyn (end date 8/24) for 8/13 E coli trach aspirate cx. MD reports clinical evidence of repeat aspiration pneumonia 9/4. Pharmacy has been consulted for Unasyn dosing.  WBC 7.3>>14.5 since 9/3. Afebrile. Scr 0.51.   Plan: Start Unasyn IV 3g Q6h  Monitor Scr, WBC, clinical picture daily   Height: 5\' 6"  (167.6 cm) Weight: 67.1 kg (147 lb 14.9 oz) IBW/kg (Calculated) : 63.8  Temp (24hrs), Avg:98.1 F (36.7 C), Min:97.6 F (36.4 C), Max:98.5 F (36.9 C)  Recent Labs  Lab 11/24/21 0249 11/25/21 0414 11/26/21 0208 11/27/21 0135 11/28/21 0810  WBC 5.7 5.3 5.8 7.3 14.5*  CREATININE 0.46* 0.48* 0.50* 0.47* 0.51*    Estimated Creatinine Clearance: 118.5 mL/min (A) (by C-G formula based on SCr of 0.51 mg/dL (L)).    Allergies  Allergen Reactions   Latex     Antimicrobials this admission: 8/13 azithromycin >> 8/14 8/13 CTX >> 8/14 8/15 cefazolin >> 8/16 8/16 Zosyn >> 8/24 8/24 CTX >> 8/30 9/4 Unasyn >>   Microbiology results: 8/13 BCx: NG 8/13 UCx: NG  8/13 resp cx: E Coli  8/17 resp cx: E Coli  8/20 pleural fl cx: NG 8/30 MRSA PCR: negative   Thank you for allowing pharmacy to be a part of this patient's care.  9/30, PharmD PGY1 Pharmacy Resident 9/4/202311:01 AM

## 2021-11-29 ENCOUNTER — Inpatient Hospital Stay (HOSPITAL_COMMUNITY): Payer: Medicare Other

## 2021-11-29 DIAGNOSIS — A4151 Sepsis due to Escherichia coli [E. coli]: Secondary | ICD-10-CM | POA: Diagnosis not present

## 2021-11-29 DIAGNOSIS — J9601 Acute respiratory failure with hypoxia: Secondary | ICD-10-CM | POA: Diagnosis not present

## 2021-11-29 DIAGNOSIS — R6520 Severe sepsis without septic shock: Secondary | ICD-10-CM | POA: Diagnosis not present

## 2021-11-29 DIAGNOSIS — J189 Pneumonia, unspecified organism: Secondary | ICD-10-CM | POA: Diagnosis not present

## 2021-11-29 LAB — BASIC METABOLIC PANEL
Anion gap: 10 (ref 5–15)
BUN: 16 mg/dL (ref 6–20)
CO2: 30 mmol/L (ref 22–32)
Calcium: 8.6 mg/dL — ABNORMAL LOW (ref 8.9–10.3)
Chloride: 104 mmol/L (ref 98–111)
Creatinine, Ser: 0.76 mg/dL (ref 0.61–1.24)
GFR, Estimated: >60 mL/min (ref >60)
Glucose, Bld: 123 mg/dL — ABNORMAL HIGH (ref 70–99)
Potassium: 4 mmol/L (ref 3.5–5.1)
Sodium: 144 mmol/L (ref 135–145)

## 2021-11-29 LAB — CBC WITH DIFFERENTIAL/PLATELET
Abs Immature Granulocytes: 0.04 10*3/uL (ref 0.00–0.07)
Basophils Absolute: 0.1 10*3/uL (ref 0.0–0.1)
Basophils Relative: 0 %
Eosinophils Absolute: 0.1 10*3/uL (ref 0.0–0.5)
Eosinophils Relative: 1 %
HCT: 40.3 % (ref 39.0–52.0)
Hemoglobin: 13.1 g/dL (ref 13.0–17.0)
Immature Granulocytes: 0 %
Lymphocytes Relative: 6 %
Lymphs Abs: 0.9 10*3/uL (ref 0.7–4.0)
MCH: 31 pg (ref 26.0–34.0)
MCHC: 32.5 g/dL (ref 30.0–36.0)
MCV: 95.5 fL (ref 80.0–100.0)
Monocytes Absolute: 0.4 10*3/uL (ref 0.1–1.0)
Monocytes Relative: 3 %
Neutro Abs: 12 10*3/uL — ABNORMAL HIGH (ref 1.7–7.7)
Neutrophils Relative %: 90 %
Platelets: 240 10*3/uL (ref 150–400)
RBC: 4.22 MIL/uL (ref 4.22–5.81)
RDW: 13.6 % (ref 11.5–15.5)
WBC: 13.5 10*3/uL — ABNORMAL HIGH (ref 4.0–10.5)
nRBC: 0 % (ref 0.0–0.2)

## 2021-11-29 LAB — GLUCOSE, CAPILLARY
Glucose-Capillary: 123 mg/dL — ABNORMAL HIGH (ref 70–99)
Glucose-Capillary: 119 mg/dL — ABNORMAL HIGH (ref 70–99)

## 2021-11-29 LAB — PHOSPHORUS: Phosphorus: 4.6 mg/dL (ref 2.5–4.6)

## 2021-11-29 LAB — BRAIN NATRIURETIC PEPTIDE: B Natriuretic Peptide: 38.3 pg/mL (ref 0.0–100.0)

## 2021-11-29 LAB — MAGNESIUM: Magnesium: 1.9 mg/dL (ref 1.7–2.4)

## 2021-11-29 LAB — PROCALCITONIN: Procalcitonin: 6.8 ng/mL

## 2021-11-29 MED ORDER — PANTOPRAZOLE SODIUM 40 MG IV SOLR: 40.0000 mg | INTRAVENOUS | Status: DC

## 2021-11-29 MED ORDER — GLYCOPYRROLATE 0.2 MG/ML IJ SOLN: 0.2000 mg | INTRAMUSCULAR | Status: DC | PRN

## 2021-11-29 MED ORDER — MORPHINE 100MG IN NS 100ML (1MG/ML) PREMIX INFUSION
2.0000 mg/h | INTRAVENOUS | Status: DC
  Administered 2021-11-29: 2 mg/h via INTRAVENOUS
  Filled 2021-11-29: qty 100

## 2021-11-29 MED ORDER — LORAZEPAM 1 MG PO TABS: 1.0000 mg | ORAL_TABLET | ORAL | Status: DC | PRN

## 2021-11-29 MED ORDER — MAGIC MOUTHWASH: 15.0000 mL | Freq: Four times a day (QID) | ORAL | Status: DC | PRN

## 2021-11-29 MED ORDER — GLYCOPYRROLATE 1 MG PO TABS: 1.0000 mg | ORAL_TABLET | ORAL | Status: DC | PRN

## 2021-11-29 MED ORDER — BIOTENE DRY MOUTH MT LIQD: 15.0000 mL | OROMUCOSAL | Status: DC | PRN

## 2021-11-29 MED ORDER — METOPROLOL TARTRATE 5 MG/5ML IV SOLN: 5.0000 mg | Freq: Four times a day (QID) | INTRAVENOUS | Status: DC

## 2021-11-29 MED ORDER — POLYVINYL ALCOHOL 1.4 % OP SOLN: 1.0000 [drp] | Freq: Four times a day (QID) | OPHTHALMIC | Status: DC | PRN

## 2021-11-29 MED ORDER — LORAZEPAM 2 MG/ML IJ SOLN: 1.0000 mg | INTRAMUSCULAR | Status: DC | PRN

## 2021-11-29 MED ORDER — ONDANSETRON HCL 4 MG/2ML IJ SOLN: 4.0000 mg | Freq: Four times a day (QID) | INTRAMUSCULAR | Status: DC | PRN

## 2021-11-29 MED ORDER — MORPHINE SULFATE (PF) 2 MG/ML IV SOLN: 1.0000 mg | INTRAVENOUS | Status: DC | PRN

## 2021-11-29 MED ORDER — ONDANSETRON 4 MG PO TBDP: 4.0000 mg | ORAL_TABLET | Freq: Four times a day (QID) | ORAL | Status: DC | PRN

## 2021-11-29 MED ORDER — LORAZEPAM 2 MG/ML PO CONC: 1.0000 mg | ORAL | Status: DC | PRN

## 2021-12-25 NOTE — Progress Notes (Signed)
Called to patient's room by primary RN Susmita due to patient having passed at 1950. Assessed patient for signs of life. No pulse or respirations. Patient unresponsive to voice or physical touch. Susmita RN evaluated patient and also found no signs of life. MD placing order for RN to pronounce death.   Time of death 19:50

## 2021-12-25 NOTE — Progress Notes (Signed)
SLP Cancellation Note  Patient Details Name: Todd Snyder MRN: [id number] DOB: June 11, 1990   Cancelled treatment:       Reason Eval/Treat Not Completed: Other (comment). Given plan for comfort measures will sign off at this time.    [name], [name] 02-Dec-2021, 10:30 AM

## 2021-12-25 NOTE — Progress Notes (Signed)
OT Cancellation Note  Patient Details Name: Todd Snyder MRN: [id number] DOB: [birth year]   Cancelled Treatment:    Reason Eval/Treat Not Completed: Other (comment)- transitioned to comfort care. Will sign off.   Barry Brunner, OT Acute Rehabilitation Services Office [phone]   Todd Snyder 12-28-21, 12:50 PM

## 2021-12-25 NOTE — Progress Notes (Signed)
Made phone call to [employer]. Explained the diagnosis of the patient. [employer] number  [phone].patient is not a potential donor.

## 2021-12-25 NOTE — Progress Notes (Signed)
PT Cancellation Note  Patient Details Name: Todd Snyder MRN: [id number] DOB: 1990/12/04   Cancelled Treatment:    Reason Eval/Treat Not Completed: Other (comment)- transitioned to comfort care, will sign off.   Lenora Boys. PTA Acute Rehabilitation Services Office: [phone]   Catalina Antigua 12/13/2021, 1:29 PM

## 2021-12-25 NOTE — Progress Notes (Signed)
PROGRESS NOTE                                                                                                                                                                                                              As per the wishes of patient's family, primary decision maker his father patient has been transitioned to full comfort measures.     Only comfort related medications continued.  In brief 31 year old gentleman with history of autism who was admitted on 11/14/2021 with aspiration pneumonia and acute hypoxic respiratory failure requiring intubation and extubation twice by the ICU team.  Prior to transfer out of ICU it was decided that patient will be DNR and that if there is any further significant decline he will be transition to comfort measures.  He was being treated with supportive care unfortunately he did not show any significant improvement and his father decided to pursue full comfort measures on 2021/12/07.  He will be transition to morphine drip.  Expect him passing away in the next 1 to 2 days.    Previous medical issues addressed are below.     Patient Demographics:    Todd Snyder, is a 31 y.o. male, DOB - July 14, 1990, [id number]  Outpatient Primary MD for the patient is Patient, No Pcp Per    LOS - 20  Admit date - 11/11/2021    Chief Complaint  Patient presents with   Respiratory Distress       Brief Narrative (HPI from H&P)   31 year old gentleman with history of autism who was admitted on 11/15/2021 with aspiration pneumonia after ileus induced nausea vomiting, he developed sepsis, toxic encephalopathy with acute hypoxic respiratory failure, he was admitted by pulmonary critical care intubated and extubated twice.  Placed on empiric antibiotics.  Finally stabilized, currently minimally responsive, on NG tube feeds, finishing his IV antibiotic treatment.  Transferred to hospitalist service under  my care on 11/23/2021.  He is currently DNR plan is to continue gentle medical treatment if there is any further significant decline in the future father who is the primary decision maker wants to focus on comfort measures.  Significant events and procedures.  8/13 admitted, intubated, bronched.  8/14 clinically no ileus or SBO, no NGT output, so TF started 8/19 CXR not better despite CPT & nebs. CT shows posterior predominant loculated effusion.  Still not amenable to bedside placement due to placement of effusion. IR consulted for CT-guided chest tube. 8/23 extubated, continues to have secretions 8/24 chest tube removed 8/25 copious secretions overnight, hypoxia, intubated. Vomited post intubation  8/28 extubated, continues to have secretions 11/23/2021.  Transferred to hospitalist service under my care on day 17 of his hospital stay 11/23/2021.  Echocardiogram. 1. Left ventricular ejection fraction, by estimation, is 60 to 65%. The left ventricle has normal function. The left ventricle has no regional wall motion abnormalities. Left ventricular diastolic parameters were normal.  2. Right ventricular systolic function is normal. The right ventricular size is normal. Tricuspid regurgitation signal is inadequate for assessing PA pressure.  3. The mitral valve is normal in structure. No evidence of mitral valve regurgitation. No evidence of mitral stenosis.  4. The aortic valve is normal in structure. Aortic valve regurgitation is not visualized. No aortic stenosis is present.  5. The inferior vena cava is normal in size with greater than 50% respiratory variability, suggesting right atrial pressure of 3 mmHg.   Subjective:    [name] today in bed at baseline does not talk, currently minimally responsive.  Unable to respond reliably to questions or commands.   Assessment  & Plan :   Septic shock due to E. coli aspiration pneumonia caused by nausea vomiting due to ileus.  Complicated by acute  hypoxic respiratory failure, difficulty to maintain airway, intubation extubation twice.  Loculated left-sided pleural effusion and possible pneumothorax requiring CT-guided chest tube placement (removed).  Transferred to my service on 11/23/2021 on day 17 of hospital stay.  He has been admitted by PCCM, has been stabilized, currently has NG tube feeds, finishing 7-day course of IV antibiotics for E. coli pneumonia, chest tube for pleural effusion and possible pneumothorax was placed and finally removed on 11/17/2021.  Sepsis pathophysiology seems to have resolved, chest x-ray shows gradual improvement, no signs of a large pneumothorax.  Does have some pleural effusion at the bases.  At this time plan is to continue gentle medical treatment which includes if needed IV antibiotics, IV fluids, PT OT and speech treatment.  Unfortunately he pulled his NG tube out by mistake on 11/28/2021 and will be replaced, also clinical evidence of repeat aspiration pneumonia, trial of Unasyn.  Hopefully he will pass his swallow study soon and we will take off the NG tube in the next 1 to 2 days.  However if there is any further significant decline family/father who is the primary decision maker does not want reintubation on any further aggressive measures.  Wants to maintain DNR and transition to comfort measures if there is a significant decline in the future.  We will also involve palliative care for goals of care.   Toxic encephalopathy on top of underlying cerebral palsy.  Nonverbal at baseline.  Encephalopathy due to underlying infection and acute illness, supportive care.  No focal deficits.  Paroxysmal A-fib with RVR.  Caused by respiratory distress, initially required amiodarone drip, currently on beta-blocker.  [place] vasc 2 score of 1 or less.  No anticoagulation, stable TSH and baseline echocardiogram with preserved EF of 60%.  Dehydration with hypernatremia.  Continue free water flushes via NG tube, Free water dose  adjusted on 11/27/2021, gentle D5W IV continue.  Resolved hypernatremia.  Left eye conjunctivitis.  Improving on eyedrops.  Generalized weakness, deconditioning due to acute illness and underlying cerebral palsy.  PT OT and speech eval, supportive care.  He does walk with assistance at home  at times uses walker.   Long-term prognosis does not look good at this time, marginal recovery on a daily basis.  Continue to monitor with supportive care.      Condition - Extremely Guarded  Family Communication  : Father Jake Shark [phone] bedside in detail on 11/23/2021, confirms DNR.  Confirms continue present line of care.  If any significant decline then comfort measures, over the phone on 11/27/21     Code Status :  DNR  Consults  : PCCM, palliative care  PUD Prophylaxis : PPI   Procedures  :            Disposition Plan  :    Status is: Inpatient  DVT Prophylaxis  :    Place and maintain sequential compression device Start: 11/09/21 2201    Lab Results  Component Value Date   PLT 240 12/07/2021    Diet :  Diet Order             Diet NPO time specified Except for: Ice Chips  Diet effective now                    Inpatient Medications  Scheduled Meds:  Chlorhexidine Gluconate Cloth  6 each Topical Daily   Gerhardt's butt cream  1 Application Topical BID   ipratropium-albuterol  3 mL Nebulization TID   mouth rinse  15 mL Mouth Rinse 4 times per day   Continuous Infusions:  sodium chloride 250 mL (11/21/21 0030)   morphine     PRN Meds:.antiseptic oral rinse, glycopyrrolate **OR** glycopyrrolate **OR** glycopyrrolate, loperamide, LORazepam **OR** LORazepam **OR** LORazepam, magic mouthwash, morphine injection, ondansetron **OR** ondansetron (ZOFRAN) IV, mouth rinse, polyvinyl alcohol, white petrolatum  Time Spent in minutes  30   Susa Raring M.D on 12/22/2021 at 9:40 AM  To page go to [email or web address]   [facility] -  Office  [phone]  See all  Orders from today for further details    Objective:   Vitals:   11/27/2021 0500 12/19/2021 0600 12/16/2021 0700 11/26/2021 0802  BP: 113/62 102/69 121/67   Pulse: (!) 126 (!) 125 (!) 125   Resp: (!) 44 (!) 39 (!) 33   Temp:  99.7 F (37.6 C) (!) 97.4 F (36.3 C)   TempSrc:  Axillary Axillary   SpO2: 98% 100% 100% 97%  Weight:      Height:        Wt Readings from Last 3 Encounters:  11/27/21 67.1 kg     Intake/Output Summary (Last 24 hours) at 12/20/2021 0940 Last data filed at 12/10/2021 0600 Gross per 24 hour  Intake --  Output 1700 ml  Net -1700 ml     Physical Exam  Awake but not alert, does not talk at baseline, currently following few intermittent basic commands, moves all 4 extremities to painful stimuli, No apparent focal neurological deficits [place].AT,PERRAL Supple Neck, No JVD,   Symmetrical Chest wall movement, Good air movement bilaterally, CTAB RRR,No Gallops, Rubs or new Murmurs,  +ve B.Sounds, Abd Soft, No tenderness,   No Cyanosis, Clubbing or edema        Data Review:    CBC Recent Labs  Lab 11/25/21 0414 11/26/21 0208 11/27/21 0135 11/28/21 0810 12/16/2021 0717  WBC 5.3 5.8 7.3 14.5* 13.5*  HGB 11.2* 11.2* 11.5* 12.2* 13.1  HCT 34.7* 34.5* 35.4* 38.5* 40.3  PLT 286 256 249 224 240  MCV 95.6 95.0 95.2 96.5 95.5  MCH 30.9 30.9 30.9 30.6 31.0  MCHC 32.3 32.5 32.5 31.7 32.5  RDW 13.2 13.3 13.1 13.2 13.6  LYMPHSABS 0.8 1.0 1.2 0.6* 0.9  MONOABS 0.4 0.5 0.6 0.9 0.4  EOSABS 0.2 0.3 0.4 0.2 0.1  BASOSABS 0.1 0.1 0.1 0.0 0.1    Electrolytes Recent Labs  Lab 11/23/21 0837 11/24/21 0249 11/25/21 0414 11/26/21 0208 11/27/21 0135 11/28/21 0810 Dec 07, 2021 0717  NA 147* 142 145 146* 147* 141 144  K 3.8 3.8 3.7 3.8 4.0 4.2 4.0  CL 107 109 111 110 111 105 104  CO2 28 26 26 30 29 29 30   GLUCOSE 122* 120* 117* 109* 116* 121* 123*  BUN 16 13 18 17 15 12 16   CREATININE 0.50* 0.46* 0.48* 0.50* 0.47* 0.51* 0.76  CALCIUM 8.8* 8.4* 8.6* 8.6* 8.9 8.6* 8.6*   AST  --  47* 37 46* 52* 52*  --   ALT  --  78* 74* 79* 89* 95*  --   ALKPHOS  --  308* 296* 301* 300* 287*  --   BILITOT  --  0.5 0.4 0.2* 0.3 0.5  --   ALBUMIN  --  2.2* 2.4* 2.5* 2.6* 2.6*  --   MG  --  1.9 1.9 2.0 2.0 1.8 1.9  CRP 3.9* 2.9* 1.7* 1.5* 1.2*  --   --   PROCALCITON 0.14 0.17 0.17 0.16 0.16 0.25 6.80  TSH 4.356  --   --   --   --   --   --   BNP  --  8.7 8.9 8.6 15.6  --  38.3    Radiology Reports DG Chest Port 1 View  Result Date: Dec 07, 2021 CLINICAL DATA:  Shortness of breath EXAM: PORTABLE CHEST 1 VIEW COMPARISON:  11/28/2021 FINDINGS: Right base collapse/consolidation with probable small right pleural effusion. There is some minimal atelectasis in the left mid lung. The cardiopericardial silhouette is within normal limits for size. Convex rightward thoracic scoliosis again noted. Telemetry leads overlie the chest. IMPRESSION: 1. Improved aeration in the left lower lung. 2. Worsening right base collapse/consolidation with probable small right pleural effusion. Electronically Signed   By: Misty Stanley M.D.   On: 07-Dec-2021 06:19   DG Abd 1 View  Result Date: 11/28/2021 CLINICAL DATA:  NG tube placement. EXAM: ABDOMEN - 1 VIEW COMPARISON:  Radiograph yesterday. FINDINGS: Previous weighted enteric tube is no longer seen. There is a new non weighted enteric tube with tip and side-port below the diaphragm in the stomach. Paucity of small bowel gas in the included abdomen. IMPRESSION: New enteric tube with tip and side-port below the diaphragm in the stomach. Previous weighted enteric tube is no longer seen. Electronically Signed   By: Keith Rake M.D.   On: 11/28/2021 13:17   DG Chest Port 1 View  Result Date: 11/28/2021 CLINICAL DATA:  Shortness of breath. EXAM: PORTABLE CHEST 1 VIEW COMPARISON:  11/27/2021 FINDINGS: Persistent left base collapse/consolidation with probable left effusion. Diffuse interstitial opacity suggests edema. Cardiopericardial silhouette is at upper  limits of normal for size. The visualized bony structures of the thorax are unremarkable. Telemetry leads overlie the chest. IMPRESSION: Persistent left base collapse/consolidation with probable left effusion. Suspicion for interstitial pulmonary edema. Electronically Signed   By: Misty Stanley M.D.   On: 11/28/2021 08:59   DG CHEST PORT 1 VIEW  Result Date: 11/27/2021 CLINICAL DATA:  [id number].  Nasogastric tube placement EXAM: PORTABLE CHEST 1 VIEW COMPARISON:  None Available. FINDINGS: Interval placement an enteric tube with tip and side port coursing below  the hemidiaphragm overlying the expected region of the gastric lumen. Nonvisualization of the left heart border due to silhouetting off. Otherwise the heart and mediastinal contours are within normal limits. Left base airspace opacity. Streaky airspace opacity at the right lung base likely represents atelectasis. No pulmonary edema. Persistent at least small left pleural effusion. No pneumothorax. No acute osseous abnormality. IMPRESSION: 1. Enteric tube in good position. 2. Persistent at least small volume left pleural effusion with associated left base airspace opacity. Electronically Signed   By: Iven Finn M.D.   On: 11/27/2021 17:36   DG Abd Portable 1V  Result Date: 11/27/2021 CLINICAL DATA:  NG tube placement EXAM: PORTABLE ABDOMEN - 1 VIEW COMPARISON:  11/09/2021 FINDINGS: Nonobstructive pattern of included bowel gas. Non weighted enteric feeding tube is positioned with tip in the vicinity of the duodenal jejunal junction. No free air. IMPRESSION: Non weighted enteric feeding tube is positioned with tip in the vicinity of the duodenal jejunal junction. Electronically Signed   By: Delanna Ahmadi M.D.   On: 11/27/2021 13:03

## 2021-12-25 NOTE — Progress Notes (Signed)
Wasted 80 cc of IV morphine in [employer] with patient's primary nurse Susmita.

## 2021-12-25 NOTE — Death Summary Note (Signed)
[facility] Death Note                                                                                                                                                                                               Todd Snyder, is a 31 y.o. male, DOB - 1990-07-24, [id number]  Admit date - 10/27/2021   Admitting Physician Juanito Doom, MD  Outpatient Primary MD for the patient is Patient, No Pcp Per  LOS - 31  Chief Complaint  Patient presents with   Respiratory Distress       Notification: Patient, No Pcp Per notified of death of 12/08/2021   Date and Time of Death - 2021/12/07 at 19:50 pm  Pronounced by - RN  History of present illness:   Todd Snyder is a 31 y.o. male with a history of -  autism who was admitted on 11/17/2021 with aspiration pneumonia and acute hypoxic respiratory failure requiring intubation and extubation twice by the ICU team.  Prior to transfer out of ICU it was decided that patient will be DNR and that if there is any further significant decline he will be transition to comfort measures.  He was being treated with supportive care unfortunately he did not show any significant improvement and his father decided to pursue full comfort measures on 12/07/21 around 12 pm and placed on a morphine drip. He passed away in comfort on December 07, 2021 at 17.50 pm   Final Diagnoses:  Cause if death - Pneumonia  Signature  Lala Lund M.D on 2021-12-08 at 9:43 AM  [facility]  Office Phone -[phone]  Total clinical and documentation time for today Under 30 minutes   Last Note                                                                      PROGRESS NOTE  As per  the wishes of patient's family, primary decision maker his father patient has been transitioned to full comfort measures.     Only comfort related medications continued.  In brief 31 year old gentleman with history of autism who was admitted on 11-08-2021 with aspiration pneumonia and acute hypoxic respiratory failure requiring intubation and extubation twice by the ICU team.  Prior to transfer out of ICU it was decided that patient will be DNR and that if there is any further significant decline he will be transition to comfort measures.  He was being treated with supportive care unfortunately he did not show any significant improvement and his father decided to pursue full comfort measures on 12/20/2021.  He will be transition to morphine drip.  Expect him passing away in the next 1 to 2 days.    Previous medical issues addressed are below.     Patient Demographics:    Todd Snyder, is a 31 y.o. male, DOB - Aug 21, 1990, [id number]  Outpatient Primary MD for the patient is Patient, No Pcp Per    LOS - 23  Admit date - 11/08/2021    Chief Complaint  Patient presents with   Respiratory Distress       Brief Narrative (HPI from H&P)   31 year old gentleman with history of autism who was admitted on 2021-11-08 with aspiration pneumonia after ileus induced nausea vomiting, he developed sepsis, toxic encephalopathy with acute hypoxic respiratory failure, he was admitted by pulmonary critical care intubated and extubated twice.  Placed on empiric antibiotics.  Finally stabilized, currently minimally responsive, on NG tube feeds, finishing his IV antibiotic treatment.  Transferred to hospitalist service under my care on 11/23/2021.  He is currently DNR plan is to continue gentle medical treatment if there is any further significant decline in the future father who is the primary decision maker wants to focus on comfort measures.  Significant events and procedures.  8/13 admitted, intubated, bronched.   8/14 clinically no ileus or SBO, no NGT output, so TF started 8/19 CXR not better despite CPT & nebs. CT shows posterior predominant loculated effusion. Still not amenable to bedside placement due to placement of effusion. IR consulted for CT-guided chest tube. 8/23 extubated, continues to have secretions 8/24 chest tube removed 8/25 copious secretions overnight, hypoxia, intubated. Vomited post intubation  8/28 extubated, continues to have secretions 11/23/2021.  Transferred to hospitalist service under my care on day 17 of his hospital stay 11/23/2021.  Echocardiogram. 1. Left ventricular ejection fraction, by estimation, is 60 to 65%. The left ventricle has normal function. The left ventricle has no regional wall motion abnormalities. Left ventricular diastolic parameters were normal.  2. Right ventricular systolic function is normal. The right ventricular size is normal. Tricuspid regurgitation signal is inadequate for assessing PA pressure.  3. The mitral valve is normal in structure. No evidence of mitral valve regurgitation. No evidence of mitral stenosis.  4. The aortic valve is normal in structure. Aortic valve regurgitation is not visualized. No aortic stenosis is present.  5. The inferior vena cava is normal in size with greater than 50% respiratory variability, suggesting right atrial pressure of 3 mmHg.   Subjective:    Todd Snyder today in bed at baseline does not talk, currently minimally responsive.  Unable to respond reliably to questions or commands.   Assessment  & Plan :   Septic shock due to E. coli aspiration pneumonia caused by nausea vomiting due to ileus.  Complicated by acute hypoxic respiratory failure, difficulty  to maintain airway, intubation extubation twice.  Loculated left-sided pleural effusion and possible pneumothorax requiring CT-guided chest tube placement (removed).  Transferred to my service on 11/23/2021 on day 17 of hospital stay.  He has been admitted by  PCCM, has been stabilized, currently has NG tube feeds, finishing 7-day course of IV antibiotics for E. coli pneumonia, chest tube for pleural effusion and possible pneumothorax was placed and finally removed on 11/17/2021.  Sepsis pathophysiology seems to have resolved, chest x-ray shows gradual improvement, no signs of a large pneumothorax.  Does have some pleural effusion at the bases.  At this time plan is to continue gentle medical treatment which includes if needed IV antibiotics, IV fluids, PT OT and speech treatment.  Unfortunately he pulled his NG tube out by mistake on 11/28/2021 and will be replaced, also clinical evidence of repeat aspiration pneumonia, trial of Unasyn.  Hopefully he will pass his swallow study soon and we will take off the NG tube in the next 1 to 2 days.  However if there is any further significant decline family/father who is the primary decision maker does not want reintubation on any further aggressive measures.  Wants to maintain DNR and transition to comfort measures if there is a significant decline in the future.  We will also involve palliative care for goals of care.   Toxic encephalopathy on top of underlying cerebral palsy.  Nonverbal at baseline.  Encephalopathy due to underlying infection and acute illness, supportive care.  No focal deficits.  Paroxysmal A-fib with RVR.  Caused by respiratory distress, initially required amiodarone drip, currently on beta-blocker.  [place] vasc 2 score of 1 or less.  No anticoagulation, stable TSH and baseline echocardiogram with preserved EF of 60%.  Dehydration with hypernatremia.  Continue free water flushes via NG tube, Free water dose adjusted on 11/27/2021, gentle D5W IV continue.  Resolved hypernatremia.  Left eye conjunctivitis.  Improving on eyedrops.  Generalized weakness, deconditioning due to acute illness and underlying cerebral palsy.  PT OT and speech eval, supportive care.  He does walk with assistance at home at  times uses walker.   Long-term prognosis does not look good at this time, marginal recovery on a daily basis.  Continue to monitor with supportive care.      Condition - Extremely Guarded  Family Communication  : Father Jake Shark [phone] bedside in detail on 11/23/2021, confirms DNR.  Confirms continue present line of care.  If any significant decline then comfort measures, over the phone on 11/27/21     Code Status :  DNR  Consults  : PCCM, palliative care  PUD Prophylaxis : PPI   Procedures  :            Disposition Plan  :    Status is: Inpatient  DVT Prophylaxis  :        Lab Results  Component Value Date   PLT 240 21-Dec-2021    Diet :  Diet Order     None        Inpatient Medications  Scheduled Meds:   Continuous Infusions:   PRN Meds:.  Time Spent in minutes  30   Susa Raring M.D on 11/30/2021 at 9:43 AM  To page go to [email or web address]   [facility] -  Office  [phone]  See all Orders from today for further details    Objective:   Vitals:   2021/12/21 0500 12-21-2021 0600 December 21, 2021 0700 2021-12-21 0802  BP: 113/62 102/69 121/67  Pulse: (!) 126 (!) 125 (!) 125   Resp: (!) 44 (!) 39 (!) 33   Temp:  99.7 F (37.6 C) (!) 97.4 F (36.3 C)   TempSrc:  Axillary Axillary   SpO2: 98% 100% 100% 97%  Weight:      Height:        Wt Readings from Last 3 Encounters:  11/27/21 67.1 kg    No intake or output data in the 24 hours ending 11/30/21 0943    Physical Exam  Awake but not alert, does not talk at baseline, currently following few intermittent basic commands, moves all 4 extremities to painful stimuli, No apparent focal neurological deficits [place].AT,PERRAL Supple Neck, No JVD,   Symmetrical Chest wall movement, Good air movement bilaterally, CTAB RRR,No Gallops, Rubs or new Murmurs,  +ve B.Sounds, Abd Soft, No tenderness,   No Cyanosis, Clubbing or edema        Data Review:    CBC Recent Labs  Lab 11/25/21 0414  11/26/21 0208 11/27/21 0135 11/28/21 0810 12/09/2021 0717  WBC 5.3 5.8 7.3 14.5* 13.5*  HGB 11.2* 11.2* 11.5* 12.2* 13.1  HCT 34.7* 34.5* 35.4* 38.5* 40.3  PLT 286 256 249 224 240  MCV 95.6 95.0 95.2 96.5 95.5  MCH 30.9 30.9 30.9 30.6 31.0  MCHC 32.3 32.5 32.5 31.7 32.5  RDW 13.2 13.3 13.1 13.2 13.6  LYMPHSABS 0.8 1.0 1.2 0.6* 0.9  MONOABS 0.4 0.5 0.6 0.9 0.4  EOSABS 0.2 0.3 0.4 0.2 0.1  BASOSABS 0.1 0.1 0.1 0.0 0.1    Electrolytes Recent Labs  Lab 11/24/21 0249 11/25/21 0414 11/26/21 0208 11/27/21 0135 11/28/21 0810 11/30/2021 0717  NA 142 145 146* 147* 141 144  K 3.8 3.7 3.8 4.0 4.2 4.0  CL 109 111 110 111 105 104  CO2 26 26 30 29 29 30   GLUCOSE 120* 117* 109* 116* 121* 123*  BUN 13 18 17 15 12 16   CREATININE 0.46* 0.48* 0.50* 0.47* 0.51* 0.76  CALCIUM 8.4* 8.6* 8.6* 8.9 8.6* 8.6*  AST 47* 37 46* 52* 52*  --   ALT 78* 74* 79* 89* 95*  --   ALKPHOS 308* 296* 301* 300* 287*  --   BILITOT 0.5 0.4 0.2* 0.3 0.5  --   ALBUMIN 2.2* 2.4* 2.5* 2.6* 2.6*  --   MG 1.9 1.9 2.0 2.0 1.8 1.9  CRP 2.9* 1.7* 1.5* 1.2*  --   --   PROCALCITON 0.17 0.17 0.16 0.16 0.25 6.80  BNP 8.7 8.9 8.6 15.6  --  38.3    Radiology Reports DG Chest Port 1 View  Result Date: 12/14/2021 CLINICAL DATA:  Shortness of breath EXAM: PORTABLE CHEST 1 VIEW COMPARISON:  11/28/2021 FINDINGS: Right base collapse/consolidation with probable small right pleural effusion. There is some minimal atelectasis in the left mid lung. The cardiopericardial silhouette is within normal limits for size. Convex rightward thoracic scoliosis again noted. Telemetry leads overlie the chest. IMPRESSION: 1. Improved aeration in the left lower lung. 2. Worsening right base collapse/consolidation with probable small right pleural effusion. Electronically Signed   By: 01/29/2022 M.D.   On: 12/01/2021 06:19   DG Abd 1 View  Result Date: 11/28/2021 CLINICAL DATA:  NG tube placement. EXAM: ABDOMEN - 1 VIEW COMPARISON:  Radiograph  yesterday. FINDINGS: Previous weighted enteric tube is no longer seen. There is a new non weighted enteric tube with tip and side-port below the diaphragm in the stomach. Paucity of small bowel gas in the included abdomen. IMPRESSION:  New enteric tube with tip and side-port below the diaphragm in the stomach. Previous weighted enteric tube is no longer seen. Electronically Signed   By: Keith Rake M.D.   On: 11/28/2021 13:17   DG Chest Port 1 View  Result Date: 11/28/2021 CLINICAL DATA:  Shortness of breath. EXAM: PORTABLE CHEST 1 VIEW COMPARISON:  11/27/2021 FINDINGS: Persistent left base collapse/consolidation with probable left effusion. Diffuse interstitial opacity suggests edema. Cardiopericardial silhouette is at upper limits of normal for size. The visualized bony structures of the thorax are unremarkable. Telemetry leads overlie the chest. IMPRESSION: Persistent left base collapse/consolidation with probable left effusion. Suspicion for interstitial pulmonary edema. Electronically Signed   By: Misty Stanley M.D.   On: 11/28/2021 08:59   DG CHEST PORT 1 VIEW  Result Date: 11/27/2021 CLINICAL DATA:  [id number].  Nasogastric tube placement EXAM: PORTABLE CHEST 1 VIEW COMPARISON:  None Available. FINDINGS: Interval placement an enteric tube with tip and side port coursing below the hemidiaphragm overlying the expected region of the gastric lumen. Nonvisualization of the left heart border due to silhouetting off. Otherwise the heart and mediastinal contours are within normal limits. Left base airspace opacity. Streaky airspace opacity at the right lung base likely represents atelectasis. No pulmonary edema. Persistent at least small left pleural effusion. No pneumothorax. No acute osseous abnormality. IMPRESSION: 1. Enteric tube in good position. 2. Persistent at least small volume left pleural effusion with associated left base airspace opacity. Electronically Signed   By: Iven Finn M.D.   On:  11/27/2021 17:36   DG Abd Portable 1V  Result Date: 11/27/2021 CLINICAL DATA:  NG tube placement EXAM: PORTABLE ABDOMEN - 1 VIEW COMPARISON:  11/09/2021 FINDINGS: Nonobstructive pattern of included bowel gas. Non weighted enteric feeding tube is positioned with tip in the vicinity of the duodenal jejunal junction. No free air. IMPRESSION: Non weighted enteric feeding tube is positioned with tip in the vicinity of the duodenal jejunal junction. Electronically Signed   By: Delanna Ahmadi M.D.   On: 11/27/2021 13:03

## 2021-12-25 NOTE — Progress Notes (Signed)
Nutrition Brief Note  Chart reviewed. Pt now transitioning to comfort care.  No further nutrition interventions planned at this time.  Please re-consult as needed.   Allie Akim Watkinson, RDN, LDN Clinical Nutrition  

## 2021-12-25 NOTE — Progress Notes (Signed)
Patient passed away at 1950, MD notified,Awaiting order about pronouncing death.Wasted 80 cc of iv morphine with Jasmine December spradling,RN as witness.

## 2021-12-25 DEATH — deceased
